# Patient Record
Sex: Male | Born: 1945 | Race: White | Hispanic: No | Marital: Married | Smoking: Former smoker
Health system: Southern US, Community
[De-identification: ages and names within clinical notes are randomized; demographics above are authoritative.]

## PROBLEM LIST (undated history)

## (undated) DIAGNOSIS — E7800 Pure hypercholesterolemia, unspecified: Secondary | ICD-10-CM

## (undated) DIAGNOSIS — I10 Essential (primary) hypertension: Secondary | ICD-10-CM

## (undated) DIAGNOSIS — G4730 Sleep apnea, unspecified: Secondary | ICD-10-CM

## (undated) DIAGNOSIS — M1990 Unspecified osteoarthritis, unspecified site: Secondary | ICD-10-CM

## (undated) DIAGNOSIS — G2581 Restless legs syndrome: Secondary | ICD-10-CM

## (undated) HISTORY — PX: SHOULDER ARTHROSCOPY: SHX128

## (undated) HISTORY — PX: TONSILLECTOMY AND ADENOIDECTOMY: SHX28

## (undated) HISTORY — PX: COLONOSCOPY: SHX174

## (undated) HISTORY — DX: Unspecified osteoarthritis, unspecified site: M19.90

---

## 2001-07-21 ENCOUNTER — Encounter (INDEPENDENT_AMBULATORY_CARE_PROVIDER_SITE_OTHER): Payer: Self-pay | Admitting: Specialist

## 2001-07-21 ENCOUNTER — Ambulatory Visit (HOSPITAL_COMMUNITY): Admission: RE | Admit: 2001-07-21 | Discharge: 2001-07-21 | Payer: Self-pay | Admitting: Gastroenterology

## 2003-04-21 ENCOUNTER — Ambulatory Visit (HOSPITAL_COMMUNITY): Admission: RE | Admit: 2003-04-21 | Discharge: 2003-04-21 | Payer: Self-pay | Admitting: Family Medicine

## 2004-04-10 ENCOUNTER — Ambulatory Visit (HOSPITAL_COMMUNITY): Admission: RE | Admit: 2004-04-10 | Discharge: 2004-04-10 | Payer: Self-pay | Admitting: Gastroenterology

## 2004-04-10 ENCOUNTER — Encounter (INDEPENDENT_AMBULATORY_CARE_PROVIDER_SITE_OTHER): Payer: Self-pay | Admitting: Specialist

## 2010-03-28 ENCOUNTER — Ambulatory Visit: Payer: Self-pay | Admitting: Oncology

## 2010-04-06 LAB — CBC WITH DIFFERENTIAL/PLATELET
BASO%: 0.7 % (ref 0.0–2.0)
Basophils Absolute: 0 10*3/uL (ref 0.0–0.1)
EOS%: 1.2 % (ref 0.0–7.0)
Eosinophils Absolute: 0.1 10*3/uL (ref 0.0–0.5)
HCT: 50.4 % — ABNORMAL HIGH (ref 38.4–49.9)
HGB: 17.6 g/dL — ABNORMAL HIGH (ref 13.0–17.1)
LYMPH%: 23.8 % (ref 14.0–49.0)
MCH: 32.9 pg (ref 27.2–33.4)
MCHC: 34.9 g/dL (ref 32.0–36.0)
MCV: 94.1 fL (ref 79.3–98.0)
MONO#: 0.6 10*3/uL (ref 0.1–0.9)
MONO%: 9.4 % (ref 0.0–14.0)
NEUT#: 3.9 10*3/uL (ref 1.5–6.5)
NEUT%: 64.9 % (ref 39.0–75.0)
Platelets: 289 10*3/uL (ref 140–400)
RBC: 5.36 10*6/uL (ref 4.20–5.82)
RDW: 12.9 % (ref 11.0–14.6)
WBC: 6 10*3/uL (ref 4.0–10.3)
lymph#: 1.4 10*3/uL (ref 0.9–3.3)

## 2010-04-06 LAB — CHCC SMEAR

## 2010-04-07 LAB — COMPREHENSIVE METABOLIC PANEL
ALT: 29 U/L (ref 0–53)
AST: 22 U/L (ref 0–37)
Albumin: 4.5 g/dL (ref 3.5–5.2)
Alkaline Phosphatase: 57 U/L (ref 39–117)
BUN: 17 mg/dL (ref 6–23)
CO2: 25 mEq/L (ref 19–32)
Calcium: 9.7 mg/dL (ref 8.4–10.5)
Chloride: 101 mEq/L (ref 96–112)
Creatinine, Ser: 0.76 mg/dL (ref 0.40–1.50)
Glucose, Bld: 104 mg/dL — ABNORMAL HIGH (ref 70–99)
Potassium: 3.9 mEq/L (ref 3.5–5.3)
Sodium: 140 mEq/L (ref 135–145)
Total Bilirubin: 0.8 mg/dL (ref 0.3–1.2)
Total Protein: 7 g/dL (ref 6.0–8.3)

## 2010-04-07 LAB — IRON AND TIBC
%SAT: 33 % (ref 20–55)
Iron: 107 ug/dL (ref 42–165)
TIBC: 325 ug/dL (ref 215–435)
UIBC: 218 ug/dL

## 2010-04-07 LAB — FERRITIN: Ferritin: 336 ng/mL — ABNORMAL HIGH (ref 22–322)

## 2010-04-07 LAB — ERYTHROPOIETIN: Erythropoietin: 5 m[IU]/mL (ref 2.6–34.0)

## 2010-08-02 ENCOUNTER — Encounter (HOSPITAL_BASED_OUTPATIENT_CLINIC_OR_DEPARTMENT_OTHER): Payer: BC Managed Care – PPO | Admitting: Oncology

## 2010-08-02 ENCOUNTER — Other Ambulatory Visit: Payer: Self-pay | Admitting: Oncology

## 2010-08-02 DIAGNOSIS — D751 Secondary polycythemia: Secondary | ICD-10-CM

## 2010-08-02 LAB — CBC WITH DIFFERENTIAL/PLATELET
BASO%: 0.2 % (ref 0.0–2.0)
Basophils Absolute: 0 10*3/uL (ref 0.0–0.1)
EOS%: 1.6 % (ref 0.0–7.0)
Eosinophils Absolute: 0.1 10*3/uL (ref 0.0–0.5)
HCT: 48.2 % (ref 38.4–49.9)
HGB: 16.8 g/dL (ref 13.0–17.1)
LYMPH%: 28.1 % (ref 14.0–49.0)
MCH: 32.6 pg (ref 27.2–33.4)
MCHC: 34.8 g/dL (ref 32.0–36.0)
MCV: 93.7 fL (ref 79.3–98.0)
MONO#: 0.5 10*3/uL (ref 0.1–0.9)
MONO%: 10.4 % (ref 0.0–14.0)
NEUT#: 2.7 10*3/uL (ref 1.5–6.5)
NEUT%: 59.7 % (ref 39.0–75.0)
Platelets: 274 10*3/uL (ref 140–400)
RBC: 5.15 10*6/uL (ref 4.20–5.82)
RDW: 13.1 % (ref 11.0–14.6)
WBC: 4.5 10*3/uL (ref 4.0–10.3)
lymph#: 1.3 10*3/uL (ref 0.9–3.3)

## 2010-08-07 LAB — JAK-2 V617F

## 2010-10-06 NOTE — Op Note (Signed)
NAMEDURAN, OHERN           ACCOUNT NO.:  [id number]   MEDICAL RECORD NO.:  [id number]          PATIENT TYPE:  AMB   LOCATION:  ENDO                         FACILITY:  [facility]   PHYSICIAN:  Petra Kuba, M.D.    DATE OF BIRTH:  09/18/1945   DATE OF PROCEDURE:  04/10/2004  DATE OF DISCHARGE:                                 OPERATIVE REPORT   PROCEDURE:  Colonoscopy with polypectomy.   INDICATIONS:  Patient with history of colon polyps.  Here for repeat  screening.  Consent was signed after risks, benefits, methods, options were  thoroughly discussed in the office.   MEDICINES USED:  1.  Demerol 100.  2.  Versed 10.   PROCEDURE:  Rectal inspection was pertinent for external hemorrhoids, small.  Digital exam was negative.  Video pediatric adjustable colonoscope was  inserted and easily advanced around the colon to the cecum.  This did not  require any abdominal pressure or any position changes.  On insertion, some  left greater than right diverticula were seen.  The cecum was identified by  the appendiceal orifice and the ileocecal valve.  The scope was slowly  withdrawn.  In the cecal pole, a small polyp was seen, snared,  electrocautery applied, and polyp resected through the scope and collected  in the trap.  On slow withdrawal in the ascending and the more proximal  transverse, three other tiny polyps were seen and were all hot biopsied x 1  and put in the same container with the polyp that was just snared.  The  scope was slowly withdrawn.  Again, the left greater than right diverticula  were confirmed.  On the left side of the colon, three tiny polyps were seen  in the descending and sigmoid and were all hot biopsied and put in the  second container.  Once back in the rectum, anorectal pull-through and  retroflexion confirmed some small hemorrhoids.  The scope was straightened  and readvanced a short ways up the left side of the colon.  Air was  suctioned and the scope  removed.  If not mentioned above, the prep was  adequate.  There was some liquid stool that required washing and suctioning.  The patient tolerated the procedure well.  There was no obvious immediate  complication.   ENDOSCOPIC DIAGNOSES:  1.  Internal and external hemorrhoids.  2.  Left greater than right diverticula.  3.  Three tiny left-sided polyps, hot biopsied  4.  One cecal polyp, small, snared.  5.  Three tiny right-sided polyps, hot biopsied.  6.  Otherwise within normal limits to the cecum.   PLAN:  Await pathology to determine future colonic screening.  I would be  happy to see him back p.r.n.  Otherwise, return care to Dr. Idell Pickles for the  customary health care maintenance to include yearly rectals and guaiacs.       MEM/MEDQ  D:  04/10/2004  T:  04/10/2004  Job:  [id number]   cc:   Petra Kuba, M.D.  [address], [address]  [place]  [place] [address]  Fax: [phone]

## 2011-01-31 ENCOUNTER — Other Ambulatory Visit: Payer: Self-pay | Admitting: Oncology

## 2011-01-31 ENCOUNTER — Encounter (HOSPITAL_BASED_OUTPATIENT_CLINIC_OR_DEPARTMENT_OTHER): Payer: Medicare Other | Admitting: Oncology

## 2011-01-31 DIAGNOSIS — D751 Secondary polycythemia: Secondary | ICD-10-CM

## 2011-01-31 LAB — CBC WITH DIFFERENTIAL/PLATELET
BASO%: 0.2 % (ref 0.0–2.0)
Basophils Absolute: 0 10*3/uL (ref 0.0–0.1)
EOS%: 1.6 % (ref 0.0–7.0)
Eosinophils Absolute: 0.1 10*3/uL (ref 0.0–0.5)
HCT: 49.3 % (ref 38.4–49.9)
HGB: 17.2 g/dL — ABNORMAL HIGH (ref 13.0–17.1)
LYMPH%: 24.9 % (ref 14.0–49.0)
MCH: 33 pg (ref 27.2–33.4)
MCHC: 34.8 g/dL (ref 32.0–36.0)
MCV: 94.9 fL (ref 79.3–98.0)
MONO#: 0.5 10*3/uL (ref 0.1–0.9)
MONO%: 10 % (ref 0.0–14.0)
NEUT#: 3.5 10*3/uL (ref 1.5–6.5)
NEUT%: 63.3 % (ref 39.0–75.0)
Platelets: 258 10*3/uL (ref 140–400)
RBC: 5.2 10*6/uL (ref 4.20–5.82)
RDW: 13.1 % (ref 11.0–14.6)
WBC: 5.5 10*3/uL (ref 4.0–10.3)
lymph#: 1.4 10*3/uL (ref 0.9–3.3)

## 2011-08-13 DIAGNOSIS — R7301 Impaired fasting glucose: Secondary | ICD-10-CM | POA: Diagnosis not present

## 2011-08-13 DIAGNOSIS — E782 Mixed hyperlipidemia: Secondary | ICD-10-CM | POA: Diagnosis not present

## 2011-08-13 DIAGNOSIS — Z79899 Other long term (current) drug therapy: Secondary | ICD-10-CM | POA: Diagnosis not present

## 2012-02-11 DIAGNOSIS — Z79899 Other long term (current) drug therapy: Secondary | ICD-10-CM | POA: Diagnosis not present

## 2012-02-11 DIAGNOSIS — E782 Mixed hyperlipidemia: Secondary | ICD-10-CM | POA: Diagnosis not present

## 2012-02-11 DIAGNOSIS — R7301 Impaired fasting glucose: Secondary | ICD-10-CM | POA: Diagnosis not present

## 2012-03-06 DIAGNOSIS — Z1331 Encounter for screening for depression: Secondary | ICD-10-CM | POA: Diagnosis not present

## 2012-03-06 DIAGNOSIS — G4730 Sleep apnea, unspecified: Secondary | ICD-10-CM | POA: Diagnosis not present

## 2012-03-06 DIAGNOSIS — R5381 Other malaise: Secondary | ICD-10-CM | POA: Diagnosis not present

## 2012-03-27 DIAGNOSIS — G479 Sleep disorder, unspecified: Secondary | ICD-10-CM | POA: Diagnosis not present

## 2012-04-01 DIAGNOSIS — G4733 Obstructive sleep apnea (adult) (pediatric): Secondary | ICD-10-CM | POA: Diagnosis not present

## 2012-06-05 ENCOUNTER — Other Ambulatory Visit: Payer: Self-pay | Admitting: Gastroenterology

## 2012-06-05 DIAGNOSIS — D126 Benign neoplasm of colon, unspecified: Secondary | ICD-10-CM | POA: Diagnosis not present

## 2012-06-05 DIAGNOSIS — K5730 Diverticulosis of large intestine without perforation or abscess without bleeding: Secondary | ICD-10-CM | POA: Diagnosis not present

## 2012-06-05 DIAGNOSIS — Z09 Encounter for follow-up examination after completed treatment for conditions other than malignant neoplasm: Secondary | ICD-10-CM | POA: Diagnosis not present

## 2012-06-05 DIAGNOSIS — Z86010 Personal history of colonic polyps: Secondary | ICD-10-CM | POA: Diagnosis not present

## 2012-06-30 DIAGNOSIS — G4733 Obstructive sleep apnea (adult) (pediatric): Secondary | ICD-10-CM | POA: Diagnosis not present

## 2012-11-07 ENCOUNTER — Emergency Department (HOSPITAL_COMMUNITY)
Admission: EM | Admit: 2012-11-07 | Discharge: 2012-11-08 | Disposition: A | Discharge status: Home / Self Care | Payer: Medicare Other | Attending: Emergency Medicine | Admitting: Emergency Medicine

## 2012-11-07 ENCOUNTER — Encounter (HOSPITAL_COMMUNITY): Payer: Self-pay

## 2012-11-07 DIAGNOSIS — G2581 Restless legs syndrome: Secondary | ICD-10-CM | POA: Diagnosis not present

## 2012-11-07 DIAGNOSIS — G4730 Sleep apnea, unspecified: Secondary | ICD-10-CM | POA: Diagnosis not present

## 2012-11-07 DIAGNOSIS — G40909 Epilepsy, unspecified, not intractable, without status epilepticus: Secondary | ICD-10-CM | POA: Insufficient documentation

## 2012-11-07 DIAGNOSIS — Z79899 Other long term (current) drug therapy: Secondary | ICD-10-CM | POA: Diagnosis not present

## 2012-11-07 DIAGNOSIS — R569 Unspecified convulsions: Secondary | ICD-10-CM | POA: Diagnosis not present

## 2012-11-07 DIAGNOSIS — R9431 Abnormal electrocardiogram [ECG] [EKG]: Secondary | ICD-10-CM | POA: Diagnosis not present

## 2012-11-07 DIAGNOSIS — E7800 Pure hypercholesterolemia, unspecified: Secondary | ICD-10-CM | POA: Diagnosis not present

## 2012-11-07 DIAGNOSIS — R55 Syncope and collapse: Secondary | ICD-10-CM | POA: Diagnosis not present

## 2012-11-07 DIAGNOSIS — R404 Transient alteration of awareness: Secondary | ICD-10-CM | POA: Diagnosis not present

## 2012-11-07 DIAGNOSIS — Z87891 Personal history of nicotine dependence: Secondary | ICD-10-CM | POA: Insufficient documentation

## 2012-11-07 DIAGNOSIS — I10 Essential (primary) hypertension: Secondary | ICD-10-CM | POA: Insufficient documentation

## 2012-11-07 HISTORY — DX: Sleep apnea, unspecified: G47.30

## 2012-11-07 HISTORY — DX: Pure hypercholesterolemia, unspecified: E78.00

## 2012-11-07 HISTORY — DX: Essential (primary) hypertension: I10

## 2012-11-07 HISTORY — DX: Restless legs syndrome: G25.81

## 2012-11-07 NOTE — ED Notes (Signed)
WUJ:[id number]<JY> Expected date:<BR> Expected time:<BR> Means of arrival:<BR> Comments:<BR> EMS, near syncope

## 2012-11-07 NOTE — ED Notes (Addendum)
Pt comes from home via EMS; pt was at home sitting down and family saw him slump over; pt says he was conscious and does remember the incident. Per EMS, stroke exam negative. Pt does have some ETOH on board. Per EMS, 12 lead unremarkable. Pt has no complaints but just wanted to come and get checked in. Pt told EMS he did have more alcohol than usual tonight. Pt does have a hx of sleep apnea and wears CPAP for it.

## 2012-11-08 ENCOUNTER — Emergency Department (HOSPITAL_COMMUNITY): Payer: Medicare Other

## 2012-11-08 DIAGNOSIS — R55 Syncope and collapse: Secondary | ICD-10-CM | POA: Diagnosis not present

## 2012-11-08 LAB — CBC
HCT: 43.8 % (ref 39.0–52.0)
Hemoglobin: 15.2 g/dL (ref 13.0–17.0)
MCH: 32.5 pg (ref 26.0–34.0)
MCHC: 34.7 g/dL (ref 30.0–36.0)
MCV: 93.8 fL (ref 78.0–100.0)
Platelets: 263 10*3/uL (ref 150–400)
RBC: 4.67 MIL/uL (ref 4.22–5.81)
RDW: 13.2 % (ref 11.5–15.5)
WBC: 5.1 10*3/uL (ref 4.0–10.5)

## 2012-11-08 LAB — POCT I-STAT, CHEM 8
BUN: 14 mg/dL (ref 6–23)
Calcium, Ion: 1.16 mmol/L (ref 1.13–1.30)
Chloride: 104 mEq/L (ref 96–112)
Creatinine, Ser: 1 mg/dL (ref 0.50–1.35)
Glucose, Bld: 128 mg/dL — ABNORMAL HIGH (ref 70–99)
HCT: 45 % (ref 39.0–52.0)
Hemoglobin: 15.3 g/dL (ref 13.0–17.0)
Potassium: 3.3 mEq/L — ABNORMAL LOW (ref 3.5–5.1)
Sodium: 144 mEq/L (ref 135–145)
TCO2: 27 mmol/L (ref 0–100)

## 2012-11-08 LAB — ETHANOL: Alcohol, Ethyl (B): 74 mg/dL — ABNORMAL HIGH (ref 0–11)

## 2012-11-08 NOTE — ED Provider Notes (Signed)
History     CSN: [id number]  Arrival date & time 11/07/12  2303   First MD Initiated Contact with Patient 11/08/12 0227      Chief Complaint  Patient presents with  . Altered Level of Consciousness     (Consider location/radiation/quality/duration/timing/severity/associated sxs/prior treatment) HPI This is a 67 year old male with a history of sleep apnea on nightly BiPAP. He is here after the patient had an altered level of consciousness. The patient himself describes it as like falling asleep yet he was aware of what people were saying and doing around him. He was unable to speak to them. A family member states his legs and left arm were trembling. He was unable to answer questions or speak for a total of about 15 minutes, although he states he was aware of this entire time. The episode ended suddenly and he was back to his normal level of consciousness. He has had some similar episodes in the past but never this severe. He states he has been compliant with his BiPAP. He admits to having some alcohol this evening but denies drinking excessively. There was no incontinence or injury to his tongue.  Past Medical History  Diagnosis Date  . Sleep apnea   . Hypertension   . High cholesterol   . Restless leg syndrome     History reviewed. No pertinent past surgical history.  No family history on file.  History  Substance Use Topics  . Smoking status: Former Games developer  . Smokeless tobacco: Not on file  . Alcohol Use: Yes     Comment: daily       Review of Systems  All other systems reviewed and are negative.    Allergies  Review of patient's allergies indicates no known allergies.  Home Medications   Current Outpatient Rx  Name  Route  Sig  Dispense  Refill  . amLODipine (NORVASC) 10 MG tablet   Oral   Take 10 mg by mouth daily.         . benazepril-hydrochlorthiazide (LOTENSIN HCT) 20-12.5 MG per tablet   Oral   Take 2 tablets by mouth daily.         .  pravastatin (PRAVACHOL) 40 MG tablet   Oral   Take 40 mg by mouth daily.         Marland Kitchen rOPINIRole (REQUIP) 4 MG tablet   Oral   Take 4 mg by mouth at bedtime.           BP 131/61  Pulse 68  Temp(Src) 97.8 F (36.6 C) (Oral)  Resp 18  SpO2 96%  Physical Exam General: Well-developed, well-nourished male in no acute distress; appearance consistent with age of record HENT: normocephalic, atraumatic Eyes: pupils equal round and reactive to light; extraocular muscles intact Neck: supple Heart: regular rate and rhythm Lungs: clear to auscultation bilaterally Abdomen: soft; nondistended; bowel sounds present Extremities: No deformity; full range of motion; pulses normal Neurologic: Awake, alert and oriented; motor function intact in all extremities and symmetric; no facial droop Skin: Warm and dry Psychiatric: Normal mood and affect    ED Course  Procedures (including critical care time)     MDM   Nursing notes and vitals signs, including pulse oximetry, reviewed.  Summary of this visit's results, reviewed by myself:  Labs:  Results for orders placed during the hospital encounter of 11/07/12 (from the past 24 hour(s))  CBC     Status: None   Collection Time    11/08/12 12:15 AM  Result Value Range   WBC 5.1  4.0 - 10.5 K/uL   RBC 4.67  4.22 - 5.81 MIL/uL   Hemoglobin 15.2  13.0 - 17.0 g/dL   HCT 16.1  09.6 - 04.5 %   MCV 93.8  78.0 - 100.0 fL   MCH 32.5  26.0 - 34.0 pg   MCHC 34.7  30.0 - 36.0 g/dL   RDW 40.9  81.1 - 91.4 %   Platelets 263  150 - 400 K/uL  ETHANOL     Status: Abnormal   Collection Time    11/08/12 12:15 AM      Result Value Range   Alcohol, Ethyl (B) 74 (*) 0 - 11 mg/dL  POCT I-STAT, CHEM 8     Status: Abnormal   Collection Time    11/08/12 12:26 AM      Result Value Range   Sodium 144  135 - 145 mEq/L   Potassium 3.3 (*) 3.5 - 5.1 mEq/L   Chloride 104  96 - 112 mEq/L   BUN 14  6 - 23 mg/dL   Creatinine, Ser 7.82  0.50 - 1.35 mg/dL    Glucose, Bld 956 (*) 70 - 99 mg/dL   Calcium, Ion 2.13  0.86 - 1.30 mmol/L   TCO2 27  0 - 100 mmol/L   Hemoglobin 15.3  13.0 - 17.0 g/dL   HCT 57.8  46.9 - 62.9 %    Imaging Studies: Ct Head Wo Contrast  11/08/2012   *RADIOLOGY REPORT*  Clinical Data: Near-syncope, history hypertension  CT HEAD WITHOUT CONTRAST  Technique:  Contiguous axial images were obtained from the base of the skull through the vertex without contrast.  Comparison: None  Findings: Normal ventricular morphology. No midline shift or mass effect. Normal appearance of brain parenchyma. No intracranial hemorrhage, mass lesion, or evidence of acute infarction. No extra-axial fluid collections. Bones sinuses unremarkable.  IMPRESSION: No acute intracranial abnormalities.   Original Report Authenticated By: Ulyses Southward, M.D.     EKG Interpretation:  Date & Time: 11/07/2012 11:14 PM  Rate: 66  Rhythm: normal sinus rhythm  QRS Axis: normal  Intervals: normal  ST/T Wave abnormalities: nonspecific T wave changes  Conduction Disutrbances:none  Narrative Interpretation: Poor R-wave progression  Old EKG Reviewed: none available  4:17 AM Patient has had no further episodes while in the ED. His symptoms are concerning for a partial seizure. He was advised that until he is cleared by a neurologist is not to drive or operate machinery. He was advised that should he do so uncalled harm to others he is responsible.         Hanley Seamen, MD 11/08/12 [phone]

## 2012-11-08 NOTE — ED Notes (Signed)
Dr. Molpus at bedside. 

## 2012-11-08 NOTE — ED Notes (Signed)
Pt has a hx of sleep apnea and desats down to 83% on RA when he falls asleep. Pt placed on 3L of O2 at this time.

## 2012-11-10 DIAGNOSIS — R404 Transient alteration of awareness: Secondary | ICD-10-CM | POA: Diagnosis not present

## 2012-12-01 ENCOUNTER — Encounter: Payer: Self-pay | Admitting: Neurology

## 2012-12-01 ENCOUNTER — Ambulatory Visit (INDEPENDENT_AMBULATORY_CARE_PROVIDER_SITE_OTHER): Payer: Medicare Other | Admitting: Neurology

## 2012-12-01 VITALS — BP 129/69 | HR 68 | Ht 71.0 in | Wt 219.0 lb

## 2012-12-01 DIAGNOSIS — M1990 Unspecified osteoarthritis, unspecified site: Secondary | ICD-10-CM

## 2012-12-01 DIAGNOSIS — G2581 Restless legs syndrome: Secondary | ICD-10-CM | POA: Diagnosis not present

## 2012-12-01 DIAGNOSIS — R404 Transient alteration of awareness: Secondary | ICD-10-CM | POA: Diagnosis not present

## 2012-12-01 DIAGNOSIS — E7800 Pure hypercholesterolemia, unspecified: Secondary | ICD-10-CM | POA: Insufficient documentation

## 2012-12-01 NOTE — Progress Notes (Signed)
[facility]  PATIENT: Justin Little DOB: 1946-03-08  HISTORICAL  Justin Little is a 67 years old right-handed Caucasian male, referred by his primary care physician Dr. Clarene Duke for evaluation of episodes of alteration of consciousness.  He has past medical history of hypertension, hyperlipidemia, he experienced a few episodes of alteration of consciousness.  He has restless leg syndrome, He usually took repinirole 4mg  every night for his restless legs, in early June 2013, he took his medicine about 9 PM, while talking with his somebody around 21 PM, he suddenly "fell into sleep" , he could not hear conversation, but could not respond, symptoms last for a few minutes, he was taking by ambulance to the emergency room in November 07 2012, CAT scan of the brain was normal, laboratory showed normal CBC, CMP, blood alcohol level was 74,  Secondary similar episode was 2 weeks later, he was playing card with his guest around 10 PM,   he took his Requip 4 mg one hour before the symptoms onset, he suddenly has his eyes closed, could hear people surrounding him, but could not respond, there was no seizure activity noticed.   There was one more episode about 2 and half weeks ago, this time he was sitting watching TV, he did not take his Requip prior to symptoms onset, he suddenly nodded off, few minutes, no loss of consciousness .  He has obstructive sleep apnea, using BiPAP machine, today's ESS is 6, Fss is 10    REVIEW OF SYSTEMS: Full 14 system review of systems performed and notable only for snoring, restless leg  ALLERGIES: No Known Allergies  HOME MEDICATIONS: Outpatient Prescriptions Prior to Visit  Medication Sig Dispense Refill  . amLODipine (NORVASC) 10 MG tablet Take 10 mg by mouth daily.      . benazepril-hydrochlorthiazide (LOTENSIN HCT) 20-12.5 MG per tablet Take 2 tablets by mouth daily.      . pravastatin (PRAVACHOL) 40 MG tablet Take 40 mg by mouth daily.      Marland Kitchen  rOPINIRole (REQUIP) 4 MG tablet Take 4 mg by mouth at bedtime.       No facility-administered medications prior to visit.    PAST MEDICAL HISTORY: Past Medical History  Diagnosis Date  . Sleep apnea   . Hypertension   . High cholesterol   . Restless leg syndrome   . Degenerative arthritis   . Restless leg syndrome   . High cholesterol     PAST SURGICAL HISTORY: Past Surgical History  Procedure Laterality Date  . Shoulder arthroscopy    . Tonsillectomy and adenoidectomy    . Colonoscopy      FAMILY HISTORY: Family History  Problem Relation Age of Onset  . Heart Problems Father   . High blood pressure Mother   . Cancer Mother     SOCIAL HISTORY:  History   Social History  . Marital Status: Married    Spouse Name: Marisue Humble    Number of Children: 2  . Years of Education: college   Occupational History  .      Sales   Social History Main Topics  . Smoking status: Former Smoker    Types: Cigarettes  . Smokeless tobacco: Never Used     Comment: Quit 1985  . Alcohol Use: 0.6 oz/week    1 Shots of liquor per week     Comment: daily   . Drug Use: No  . Sexually Active: Not on file   Other Topics Concern  .  Not on file   Social History Narrative   Patient lives at home with his wife Marisue Humble). Patient is still working works with Airline pilot.    Right handed.   Caffeine - None     PHYSICAL EXAM  Filed Vitals:   12/01/12 0910  BP: 129/69  Pulse: 68  Height: 5\' 11"  (1.803 m)  Weight: 219 lb (99.338 kg)    Not recorded    Body mass index is 30.56 kg/(m^2).   Generalized: In no acute distress  Neck: Supple, no carotid bruits   Cardiac: Regular rate rhythm  Pulmonary: Clear to auscultation bilaterally  Musculoskeletal: No deformity  Neurological examination  Mentation: Alert oriented to time, place, history taking, and causual conversation  Cranial nerve II-XII: Pupils were equal round reactive to light extraocular movements were full, visual  field were full on confrontational test. facial sensation and strength were normal. hearing was intact to finger rubbing bilaterally. Uvula tongue midline.  head turning and shoulder shrug and were normal and symmetric.Tongue protrusion into cheek strength was normal.  Motor: normal tone, bulk and strength.  Sensory: Intact to fine touch, pinprick, preserved vibratory sensation, and proprioception at toes.  Coordination: Normal finger to nose, heel-to-shin bilaterally there was no truncal ataxia  Gait: Rising up from seated position without assistance, normal stance, without trunk ataxia, moderate stride, good arm swing, smooth turning, able to perform tiptoe, and heel walking without difficulty.   Romberg signs: Negative  Deep tendon reflexes: Brachioradialis 2/2, biceps 2/2, triceps 2/2, patellar 2/2, Achilles 2/2, plantar responses were flexor bilaterally.   DIAGNOSTIC DATA (LABS, IMAGING, TESTING) - I reviewed patient records, labs, notes, testing and imaging myself where available.  Lab Results  Component Value Date   WBC 5.1 11/08/2012   HGB 15.3 11/08/2012   HCT 45.0 11/08/2012   MCV 93.8 11/08/2012   PLT 263 11/08/2012      Component Value Date/Time   NA 144 11/08/2012 0026   K 3.3* 11/08/2012 0026   CL 104 11/08/2012 0026   CO2 25 04/06/2010 1330   GLUCOSE 128* 11/08/2012 0026   BUN 14 11/08/2012 0026   CREATININE 1.00 11/08/2012 0026   CALCIUM 9.7 04/06/2010 1330   PROT 7.0 04/06/2010 1330   ALBUMIN 4.5 04/06/2010 1330   AST 22 04/06/2010 1330   ALT 29 04/06/2010 1330   ALKPHOS 57 04/06/2010 1330   BILITOT 0.8 04/06/2010 1330   ASSESSMENT AND PLAN  67 years old right-handed Caucasian male, with past medical history of hypertension, hyperlipidemia, restless leg syndrome, presenting with few episode of sudden loss of ability to intact with surrounding, lasting few minutes, there was no seizure-like activity described, normal neurological examinations.  1 differentiation  diagnosis including complex partial seizure, narcolepsy 2  MRI of the brain with and without contrast, EEG 3. No driving till episode free for 6 months   4. if he continues to have recurrent symptoms, we will consider sleep study as well       Levert Feinstein, M.D. Ph.D.  [facility] [address], [address] [place], [place] [address] [phone]

## 2012-12-04 ENCOUNTER — Ambulatory Visit (INDEPENDENT_AMBULATORY_CARE_PROVIDER_SITE_OTHER): Payer: Medicare Other

## 2012-12-04 DIAGNOSIS — E7800 Pure hypercholesterolemia, unspecified: Secondary | ICD-10-CM

## 2012-12-04 DIAGNOSIS — R404 Transient alteration of awareness: Secondary | ICD-10-CM

## 2012-12-04 DIAGNOSIS — M1990 Unspecified osteoarthritis, unspecified site: Secondary | ICD-10-CM

## 2012-12-04 DIAGNOSIS — G2581 Restless legs syndrome: Secondary | ICD-10-CM

## 2012-12-05 NOTE — Progress Notes (Signed)
This is an addendum on EEG report, he was able to achieve stage II sleep during recording

## 2012-12-05 NOTE — Procedures (Signed)
HISTORY: 67 year-old male, was episode of alteration of consciousness  TECHNIQUE:  16 channel EEG was performed based on standard 10-16 international system. One channel was dedicated to EKG, which has demonstrates normal sinus rhythm of 78 beats per minutes.  Upon awakening, the posterior background activity was well-developed, in alpha range, 10Hz ,  with amplitude of 25 microvoltage, reactive to eye opening and closure.  There was no evidence of epilepsy for discharge.  Photic stimulation was performed, which induced a symmetric photic driving.  Hyperventilation was performed, there was no abnormality elicit.  No sleep was achieved.  CONCLUSION: This is a  normal awake EEG.  There is no electrodiagnostic evidence of epileptiform discharge

## 2012-12-12 ENCOUNTER — Telehealth: Payer: Self-pay | Admitting: Neurology

## 2012-12-12 NOTE — Telephone Encounter (Signed)
I called the patient and and gave him his EEG results. And i let him know i will have someone in MRI call him with the status on his MRI. Patient will be going away in the next week and needs it done before then.

## 2012-12-19 ENCOUNTER — Ambulatory Visit
Admission: RE | Admit: 2012-12-19 | Discharge: 2012-12-19 | Disposition: A | Discharge status: Home / Self Care | Payer: Medicare Other | Source: Ambulatory Visit | Attending: Neurology | Admitting: Neurology

## 2012-12-19 ENCOUNTER — Telehealth: Payer: Self-pay | Admitting: Neurology

## 2012-12-19 DIAGNOSIS — G2581 Restless legs syndrome: Secondary | ICD-10-CM

## 2012-12-19 DIAGNOSIS — R4182 Altered mental status, unspecified: Secondary | ICD-10-CM | POA: Diagnosis not present

## 2012-12-19 DIAGNOSIS — I6789 Other cerebrovascular disease: Secondary | ICD-10-CM | POA: Diagnosis not present

## 2012-12-19 DIAGNOSIS — E7800 Pure hypercholesterolemia, unspecified: Secondary | ICD-10-CM

## 2012-12-19 DIAGNOSIS — M1990 Unspecified osteoarthritis, unspecified site: Secondary | ICD-10-CM

## 2012-12-19 DIAGNOSIS — R404 Transient alteration of awareness: Secondary | ICD-10-CM

## 2012-12-19 MED ORDER — GADOBENATE DIMEGLUMINE 529 MG/ML IV SOLN: 20.0000 mL | Freq: Once | INTRAVENOUS | Status: AC | PRN

## 2012-12-22 NOTE — Telephone Encounter (Signed)
I called the patient and let him know his results where not ready but once they are, we will give him a call with results.

## 2012-12-26 ENCOUNTER — Telehealth: Payer: Self-pay | Admitting: Neurology

## 2012-12-29 NOTE — Telephone Encounter (Signed)
This pt by the office today.  [facility] was called and stated that [facility] to read. I did not see that Dr. Pearlean Brownie read.  Dr. Marjory Lies consulted, and this was not sent over on the list for [place] to know it needed to be read.  Dr. Marjory Lies or Dr. Pearlean Brownie to read today and pt to be called.

## 2012-12-29 NOTE — Telephone Encounter (Signed)
Dr. Pearlean Brownie read MRI brain.  I spoke to patient and relayed results of generalized cerebral atrophy and mild changes of chronic microvascular ischemia, also not significant changes compared with CT head 11-08-12.  I will relay information to Dr. Terrace Arabia and see when she would like to see patient in follow up.

## 2013-01-05 ENCOUNTER — Telehealth: Payer: Self-pay | Admitting: Neurology

## 2013-01-05 NOTE — Telephone Encounter (Signed)
Please give patient a follow up in 2-3 months

## 2013-01-07 NOTE — Progress Notes (Signed)
Quick Note:  Please call patient, age related changes. ______

## 2013-01-08 ENCOUNTER — Telehealth: Payer: Self-pay

## 2013-01-08 NOTE — Telephone Encounter (Signed)
I called and spoke with wife re: MRI results. She said they came in and got the results last week. I let her know her husbands doctor was out for two weeks and just sent a note this morning. Wife asked why another doctor couldn't have read the results then. I said when results come they go to the ordering doctor, not to all doctors. She is concerned that there are no MRI changes except age related and we do not want to see her husband soon. She said, "You're not concerned that he could drive again and he could pass out". I let her know I can pass that information along and see what the doctor has to say. Wife said, we need to do things the old way and they are going to look for another doctor.

## 2013-01-08 NOTE — Telephone Encounter (Signed)
Message copied by [facility] on Thu Jan 08, 2013  8:37 AM ------      Message from: Levert Feinstein      Created: Wed Jan 07, 2013  4:45 PM       Please call patient, age related changes. ------

## 2013-01-09 ENCOUNTER — Telehealth: Payer: Self-pay | Admitting: Neurology

## 2013-01-09 NOTE — Telephone Encounter (Signed)
Patient's follow up appointment is 02-27-13.

## 2013-01-09 NOTE — Telephone Encounter (Signed)
I have discussed with Justin Little, patient and his wife are concerned about the MRI result.  I have advised my office to give him a followup in next available, we can go over film in detail during face to face encounter.

## 2013-02-27 ENCOUNTER — Encounter (INDEPENDENT_AMBULATORY_CARE_PROVIDER_SITE_OTHER): Payer: Self-pay

## 2013-02-27 ENCOUNTER — Encounter: Payer: Self-pay | Admitting: Neurology

## 2013-02-27 ENCOUNTER — Ambulatory Visit (INDEPENDENT_AMBULATORY_CARE_PROVIDER_SITE_OTHER): Payer: Medicare Other | Admitting: Neurology

## 2013-02-27 VITALS — BP 135/67 | HR 74 | Ht 71.0 in | Wt 222.0 lb

## 2013-02-27 DIAGNOSIS — M1990 Unspecified osteoarthritis, unspecified site: Secondary | ICD-10-CM

## 2013-02-27 DIAGNOSIS — R404 Transient alteration of awareness: Secondary | ICD-10-CM

## 2013-02-27 DIAGNOSIS — E7800 Pure hypercholesterolemia, unspecified: Secondary | ICD-10-CM

## 2013-02-27 DIAGNOSIS — G2581 Restless legs syndrome: Secondary | ICD-10-CM

## 2013-02-27 NOTE — Progress Notes (Signed)
[facility]  PATIENT: Justin Little DOB: 1946-04-18  HISTORICAL  Justin Little is a 67 years old right-handed Caucasian male, referred by his primary care physician Dr. Clarene Duke for evaluation of episodes of alteration of consciousness.  He has past medical history of hypertension, hyperlipidemia, he experienced a few episodes of alteration of consciousness.  He has restless leg syndrome, He usually took repinirole 4mg  every night for his restless legs, in early June 2013, he took his medicine about 9 PM, while talking with his somebody around 104 PM, he suddenly "fell into sleep" , he could not hear conversation, but could not respond, symptoms last for a few minutes, he was taking by ambulance to the emergency room in November 07 2012, CAT scan of the brain was normal, laboratory showed normal CBC, CMP, blood alcohol level was 74,  Secondary similar episode was 2 weeks later, he was playing card with his guest around 10 PM,   he took his Requip 4 mg one hour before the symptoms onset, he suddenly has his eyes closed, could hear people surrounding him, but could not respond, there was no seizure activity noticed.   There was one more episode about 2 and half weeks ago, this time he was sitting watching TV, he did not take his Requip prior to symptoms onset, he suddenly nodded off, few minutes, no loss of consciousness .  He has obstructive sleep apnea, using BiPAP machine, today's ESS is 6, Fss is 10  UPDATE Oct 10th 2014:  He is doing well now, he is still taking requip 4mg  qhs, no significant side effect. I have reviewed MRI brain film, mild small vessel disease, no acute lesions, His EEG was normal   REVIEW OF SYSTEMS: Full 14 system review of systems performed and notable only for snoring, restless leg  ALLERGIES: No Known Allergies  HOME MEDICATIONS: Outpatient Prescriptions Prior to Visit  Medication Sig Dispense Refill  . amLODipine (NORVASC) 10 MG tablet Take  10 mg by mouth daily.      Marland Kitchen aspirin 81 MG tablet Take 81 mg by mouth daily.      . benazepril-hydrochlorthiazide (LOTENSIN HCT) 20-12.5 MG per tablet Take 2 tablets by mouth daily.      . pravastatin (PRAVACHOL) 40 MG tablet Take 40 mg by mouth daily.      Marland Kitchen rOPINIRole (REQUIP) 4 MG tablet Take 4 mg by mouth at bedtime.       No facility-administered medications prior to visit.    PAST MEDICAL HISTORY: Past Medical History  Diagnosis Date  . Sleep apnea   . Hypertension   . High cholesterol   . Restless leg syndrome   . Degenerative arthritis   . Restless leg syndrome   . High cholesterol     PAST SURGICAL HISTORY: Past Surgical History  Procedure Laterality Date  . Shoulder arthroscopy    . Tonsillectomy and adenoidectomy    . Colonoscopy      FAMILY HISTORY: Family History  Problem Relation Age of Onset  . Heart Problems Father   . High blood pressure Mother   . Cancer Mother     SOCIAL HISTORY:  History   Social History  . Marital Status: Married    Spouse Name: Justin Little    Number of Children: 2  . Years of Education: college   Occupational History  .      Sales   Social History Main Topics  . Smoking status: Former Smoker    Types:  Cigarettes  . Smokeless tobacco: Never Used     Comment: Quit 1985  . Alcohol Use: 0.6 oz/week    1 Shots of liquor per week     Comment: daily   . Drug Use: No  . Sexual Activity: Not on file   Other Topics Concern  . Not on file   Social History Narrative   Patient lives at home with his wife Justin Little). Patient is still working works with Airline [employer].    Right handed.   Caffeine - None     PHYSICAL EXAM  Filed Vitals:   02/27/13 1406  BP: 135/67  Pulse: 74  Height: 5\' 11"  (1.803 m)  Weight: 222 lb (100.699 kg)    Not recorded    Body mass index is 30.98 kg/(m^2).   Generalized: In no acute distress  Neck: Supple, no carotid bruits   Cardiac: Regular rate rhythm  Pulmonary: Clear to auscultation  bilaterally  Musculoskeletal: No deformity  Neurological examination  Mentation: Alert oriented to time, place, history taking, and causual conversation  Cranial nerve II-XII: Pupils were equal round reactive to light extraocular movements were full, visual field were full on confrontational test. facial sensation and strength were normal. hearing was intact to finger rubbing bilaterally. Uvula tongue midline.  head turning and shoulder shrug and were normal and symmetric.Tongue protrusion into cheek strength was normal.  Motor: normal tone, bulk and strength.  Sensory: Intact to fine touch, pinprick, preserved vibratory sensation, and proprioception at toes.  Coordination: Normal finger to nose, heel-to-shin bilaterally there was no truncal ataxia  Gait: Rising up from seated position without assistance, normal stance, without trunk ataxia, moderate stride, good arm swing, smooth turning, able to perform tiptoe, and heel walking without difficulty.   Romberg signs: Negative  Deep tendon reflexes: Brachioradialis 2/2, biceps 2/2, triceps 2/2, patellar 2/2, Achilles 2/2, plantar responses were flexor bilaterally.   DIAGNOSTIC DATA (LABS, IMAGING, TESTING) - I reviewed patient records, labs, notes, testing and imaging myself where available.  Lab Results  Component Value Date   WBC 5.1 11/08/2012   HGB 15.3 11/08/2012   HCT 45.0 11/08/2012   MCV 93.8 11/08/2012   PLT 263 11/08/2012      Component Value Date/Time   NA 144 11/08/2012 0026   K 3.3* 11/08/2012 0026   CL 104 11/08/2012 0026   CO2 25 04/06/2010 1330   GLUCOSE 128* 11/08/2012 0026   BUN 14 11/08/2012 0026   CREATININE 1.00 11/08/2012 0026   CALCIUM 9.7 04/06/2010 1330   PROT 7.0 04/06/2010 1330   ALBUMIN 4.5 04/06/2010 1330   AST 22 04/06/2010 1330   ALT 29 04/06/2010 1330   ALKPHOS 57 04/06/2010 1330   BILITOT 0.8 04/06/2010 1330   ASSESSMENT AND PLAN  67 years old right-handed Caucasian male, with past medical  history of hypertension, hyperlipidemia, restless leg syndrome, presenting with few episode of sudden loss of ability to intact with surrounding, lasting few minutes, there was no seizure-like activity described, normal neurological examinations.  Differentiation diagnosis including  Medicine side effect from requip, only return to clinic if new issues arise     Levert Feinstein, M.D. Ph.D.  [facility] [address] [place], [place] [address] [phone]

## 2013-04-30 DIAGNOSIS — R4182 Altered mental status, unspecified: Secondary | ICD-10-CM | POA: Diagnosis not present

## 2013-05-01 ENCOUNTER — Other Ambulatory Visit: Payer: Self-pay | Admitting: Family Medicine

## 2013-05-01 DIAGNOSIS — R4182 Altered mental status, unspecified: Secondary | ICD-10-CM

## 2013-05-06 ENCOUNTER — Ambulatory Visit
Admission: RE | Admit: 2013-05-06 | Discharge: 2013-05-06 | Disposition: A | Discharge status: Home / Self Care | Payer: Medicare Other | Source: Ambulatory Visit | Attending: Family Medicine | Admitting: Family Medicine

## 2013-05-06 DIAGNOSIS — R4182 Altered mental status, unspecified: Secondary | ICD-10-CM

## 2013-05-06 DIAGNOSIS — I658 Occlusion and stenosis of other precerebral arteries: Secondary | ICD-10-CM | POA: Diagnosis not present

## 2013-05-08 ENCOUNTER — Encounter (HOSPITAL_COMMUNITY): Payer: Self-pay | Admitting: Emergency Medicine

## 2013-05-08 ENCOUNTER — Emergency Department (HOSPITAL_COMMUNITY)
Admission: EM | Admit: 2013-05-08 | Discharge: 2013-05-08 | Disposition: A | Discharge status: Home / Self Care | Payer: Medicare Other | Attending: Emergency Medicine | Admitting: Emergency Medicine

## 2013-05-08 DIAGNOSIS — M1990 Unspecified osteoarthritis, unspecified site: Secondary | ICD-10-CM | POA: Diagnosis not present

## 2013-05-08 DIAGNOSIS — R404 Transient alteration of awareness: Secondary | ICD-10-CM | POA: Insufficient documentation

## 2013-05-08 DIAGNOSIS — R4789 Other speech disturbances: Secondary | ICD-10-CM | POA: Insufficient documentation

## 2013-05-08 DIAGNOSIS — I10 Essential (primary) hypertension: Secondary | ICD-10-CM | POA: Insufficient documentation

## 2013-05-08 DIAGNOSIS — Z87891 Personal history of nicotine dependence: Secondary | ICD-10-CM | POA: Insufficient documentation

## 2013-05-08 DIAGNOSIS — G2581 Restless legs syndrome: Secondary | ICD-10-CM | POA: Diagnosis not present

## 2013-05-08 DIAGNOSIS — Z79899 Other long term (current) drug therapy: Secondary | ICD-10-CM | POA: Insufficient documentation

## 2013-05-08 DIAGNOSIS — E7800 Pure hypercholesterolemia, unspecified: Secondary | ICD-10-CM | POA: Diagnosis not present

## 2013-05-08 DIAGNOSIS — Z7982 Long term (current) use of aspirin: Secondary | ICD-10-CM | POA: Diagnosis not present

## 2013-05-08 LAB — BASIC METABOLIC PANEL
BUN: 17 mg/dL (ref 6–23)
CO2: 24 mEq/L (ref 19–32)
Chloride: 99 mEq/L (ref 96–112)
Creatinine, Ser: 0.73 mg/dL (ref 0.50–1.35)
GFR calc Af Amer: >90 mL/min (ref >90)
GFR calc non Af Amer: >90 mL/min (ref >90)
Potassium: 3.2 mEq/L — ABNORMAL LOW (ref 3.5–5.1)
Sodium: 138 mEq/L (ref 135–145)

## 2013-05-08 LAB — CBC
HCT: 47.4 % (ref 39.0–52.0)
MCHC: 35.7 g/dL (ref 30.0–36.0)
RBC: 4.97 MIL/uL (ref 4.22–5.81)
RDW: 13.1 % (ref 11.5–15.5)
WBC: 5.5 10*3/uL (ref 4.0–10.5)

## 2013-05-08 MED ORDER — POTASSIUM CHLORIDE CRYS ER 20 MEQ PO TBCR
40.0000 meq | EXTENDED_RELEASE_TABLET | Freq: Once | ORAL | Status: AC
  Administered 2013-05-08: 40 meq via ORAL
  Filled 2013-05-08: qty 2

## 2013-05-08 MED ORDER — ROPINIROLE HCL 1 MG PO TABS
4.0000 mg | ORAL_TABLET | Freq: Every day | ORAL | Status: DC
  Administered 2013-05-08: 4 mg via ORAL
  Filled 2013-05-08: qty 4

## 2013-05-08 NOTE — ED Notes (Signed)
Pt and family report 1-2 min episode around 5pm of head going back, eyes open, not being able to speak but able to hear what is going on around him.  Family reports during episode pts L arm is moving back and forth like he is trying to use it. Pt states he has been having these episodes over the past several months.  Prior to tonight the last episode was 2 weeks ago.  Pt states he has been seen at [facility] for same, by PCP, and [facility].  Reports he has had negative EEG and MRI.  No neuro deficits at present.  Denies urinary incontinence during episodes.

## 2013-05-08 NOTE — ED Notes (Signed)
Pt reports that he has had multiple episodes over the past couple of months that consist of him having stroke like symptoms, but being able to hear everything around him during those episodes. States that he is unable to move or talk when this happens but, he is fully alert. No neuro deficits at this time. Pt A&Ox4. States that he has seen a neurologist and had an EEG, MRI and full work up.

## 2013-05-08 NOTE — ED Provider Notes (Signed)
I saw and evaluated the patient, reviewed the resident's note and I agree with the findings and plan.  EKG Interpretation    Date/Time:  Friday May 08 2013 18:27:18 EST Ventricular Rate:  90 PR Interval:  172 QRS Duration: 98 QT Interval:  350 QTC Calculation: 428 R Axis:   135 Text Interpretation:  Normal sinus rhythm Right axis deviation Pulmonary disease pattern Nonspecific ST and T wave abnormality Abnormal ECG No significant change since last tracing Confirmed by Nicole Defino  MD, Mando Blatz ([id number]) on 05/08/2013 11:01:02 PM            Outpatient seen by me in EKG interpreted as well. Patient is in the process of an extensive workup for multiple episodes over the past 2 months of either TIA or mini seizure or abs onset seizure activity. Patient's had extensive workup to this point in time has been negative. Being followed by neurology was [facility] neurology now switching to [facility] neurology. Patient is now back to baseline tonight episode was no different than the others. Discussed with family told to return for any new or worse type symptoms or anything that is different but we will be here. Otherwise continue his outpatient workup as planned. Patient is stable for discharge.  Shelda Jakes, MD 05/08/13 [phone]

## 2013-05-08 NOTE — ED Notes (Addendum)
Family brought pt food to eat. Pt ate mouthful of food in w/r at 20:20. Pt instructed not to eat anything else based on sx and complaints until he sees the MD and speaks with his RN in the back. Pt agreeable. Pt alert, NAD, calm, appropriate, speech clear, ambulatory with steady gait.

## 2013-05-08 NOTE — ED Provider Notes (Signed)
CSN: [id number]     Arrival date & time 05/08/13  1808 History   First MD Initiated Contact with Patient 05/08/13 2212     Chief Complaint  Patient presents with  . Near Syncope   HPI 67 y/o male with history as noted below who presents with syncopal like episodes. The patient state that over the past few months he "passes out" and "goes to sleep". He states that he is still able to hear people talk and what is going on around him but he is unable to move or talk when it happens. He currently denies any symptoms but states the episodes have become more frequent. He has seen neurology and has had a normal MRI, EEG and Carotid Dopplers. He is currently in the process of switching neurologist.   Past Medical History  Diagnosis Date  . Sleep apnea   . Hypertension   . High cholesterol   . Restless leg syndrome   . Degenerative arthritis   . Restless leg syndrome   . High cholesterol    Past Surgical History  Procedure Laterality Date  . Shoulder arthroscopy    . Tonsillectomy and adenoidectomy    . Colonoscopy     Family History  Problem Relation Age of Onset  . Heart Problems Father   . High blood pressure Mother   . Cancer Mother    History  Substance Use Topics  . Smoking status: Former Smoker    Types: Cigarettes  . Smokeless tobacco: Never Used     Comment: Quit 1985  . Alcohol Use: 0.6 oz/week    1 Shots of liquor per week     Comment: daily     Review of Systems  Constitutional: Negative for fever and chills.  Respiratory: Negative for shortness of breath.   Cardiovascular: Negative for chest pain and palpitations.  Gastrointestinal: Negative for nausea and vomiting.  Neurological: Positive for syncope and speech difficulty. Negative for weakness, numbness and headaches.  All other systems reviewed and are negative.    Allergies  Review of patient's allergies indicates no known allergies.  Home Medications   Current Outpatient Rx  Name  Route  Sig   Dispense  Refill  . amLODipine (NORVASC) 10 MG tablet   Oral   Take 10 mg by mouth daily.         Marland Kitchen aspirin EC 81 MG tablet   Oral   Take 324 mg by mouth daily.         . benazepril-hydrochlorthiazide (LOTENSIN HCT) 20-12.5 MG per tablet   Oral   Take 2 tablets by mouth daily.         . pravastatin (PRAVACHOL) 40 MG tablet   Oral   Take 40 mg by mouth daily.         Marland Kitchen rOPINIRole (REQUIP) 4 MG tablet   Oral   Take 4 mg by mouth at bedtime.          BP 126/72  Pulse 62  Temp(Src) 98.2 F (36.8 C) (Oral)  Resp 13  Wt 136 lb 8 oz (61.916 kg)  SpO2 91% Physical Exam  Constitutional: He is oriented to person, place, and time. He appears well-developed and well-nourished. No distress.  HENT:  Head: Normocephalic and atraumatic.  Mouth/Throat: No oropharyngeal exudate.  Eyes: Conjunctivae are normal. Pupils are equal, round, and reactive to light.  Neck: Normal range of motion. Neck supple.  Cardiovascular: Normal rate and normal heart sounds.  Exam reveals no gallop and  no friction rub.   No murmur heard. Pulmonary/Chest: Effort normal and breath sounds normal.  Abdominal: Soft. He exhibits no distension. There is no tenderness.  Musculoskeletal: Normal range of motion. He exhibits no edema and no tenderness.  Neurological: He is alert and oriented to person, place, and time. He has normal strength and normal reflexes. No cranial nerve deficit or sensory deficit. Coordination normal. GCS eye subscore is 4. GCS verbal subscore is 5. GCS motor subscore is 6.  Skin: Skin is warm and dry.    ED Course  Procedures (including critical care time) Labs Review Labs Reviewed  BASIC METABOLIC PANEL - Abnormal; Notable for the following:    Potassium 3.2 (*)    Glucose, Bld 154 (*)    All other components within normal limits  CBC   Imaging Review No results found.  EKG Interpretation    Date/Time:  Friday May 08 2013 18:27:18 EST Ventricular Rate:  90 PR  Interval:  172 QRS Duration: 98 QT Interval:  350 QTC Calculation: 428 R Axis:   135 Text Interpretation:  Normal sinus rhythm Right axis deviation Pulmonary disease pattern Nonspecific ST and T wave abnormality Abnormal ECG No significant change since last tracing Confirmed by ZACKOWSKI  MD, SCOTT ([id number]) on 05/08/2013 11:01:02 PM            MDM   67 y/o male with history as noted below who presents with syncopal like episodes. Now asymptomatic. Non-focal neurological exam. VSS and well appearing. Has seen neurology and is currently switching neurologist. Has had outpatient workup to exclude CVA, TIA and Seizures. EKG here normal. Labs with only mild hyperkalemia which was replaced. Unclear etiology but don't suspect acute neurologic emergency. Recommended continued outpatient workup with neurology. No driving or performing any dangerous activities until cleared by a physician (this was relayed to both the patient as well as his daughter and wife). Return precautions given and discussed with the patient who was in agreement with the plan.    1. Alteration consciousness       Shanon Ace, MD 05/09/13 [phone]

## 2013-05-08 NOTE — ED Notes (Signed)
EDP at bedside  

## 2013-05-22 DIAGNOSIS — E876 Hypokalemia: Secondary | ICD-10-CM | POA: Diagnosis not present

## 2013-05-22 DIAGNOSIS — R4182 Altered mental status, unspecified: Secondary | ICD-10-CM | POA: Diagnosis not present

## 2013-06-01 ENCOUNTER — Encounter: Payer: Self-pay | Admitting: Neurology

## 2013-06-01 ENCOUNTER — Ambulatory Visit (INDEPENDENT_AMBULATORY_CARE_PROVIDER_SITE_OTHER): Payer: Medicare Other | Admitting: Neurology

## 2013-06-01 VITALS — BP 120/70 | HR 78 | Temp 98.1°F | Ht 70.5 in | Wt 224.0 lb

## 2013-06-01 DIAGNOSIS — R29818 Other symptoms and signs involving the nervous system: Secondary | ICD-10-CM

## 2013-06-01 DIAGNOSIS — R295 Transient paralysis: Secondary | ICD-10-CM

## 2013-06-01 NOTE — Progress Notes (Signed)
NEUROLOGY CONSULTATION NOTE  GRANTHAM HIPPERT MRN: [id number] DOB: 12-20-1945  Referring provider: Dr. Rex Kras Primary care provider: Dr. Rex Kras  Reason for consult:  Transient mental status change.  HISTORY OF PRESENT ILLNESS: Justin Little is a 68 year old right-handed man with history of OSA, restless leg syndrome, hypertension and hyperlipidemia who presents for second opinion regarding episodic altered sensorium.  Records and images were personally reviewed where available.   The first spell occurred in June 2014.  He took his ropinirole 4mg  that night for his restless leg syndrome.  While taking to someone about an hour later, he became less responsive and closes his eyes, like he was in a sleep.  He was not sleepy and there is no warning.  He is conscious and could hear people talking to him but he was unable to respond.  His eyes are typically closed but he does recall once looking over at his wife.  He does not lose tone but he cannot move.  With effort, he attempts to move but can't.  There were no convulsions, focal abnormal movements, facial numbness, strange olfactory sensation, visual disturbance or sensation of epigastric rising.  It usually lasts less than 2 minutes, except on one or two occasions, it exceeded this.  A workup at the ED revealed normal CT of the brain and normal CBC and CMP.  ETOH level was 74.  He has had about six other spells since then.  Last spell was about 2 weeks ago.  One time, it occurred while playing cards with friends and another time it occurred while watching TV.  At first, it was thought to be a side effect of his ropinirole, which he takes for RLS, because it occurred about an hour after taking it.  However, subsequent spells occurred at different times of day and not right after taking the medication.  He has a history of OSA and uses a CPAP.  He feels rested.  No history of seizures.  He was seen by Dr. Krista Blue at [facility].  He had an EEG performed on 12/05/12, which was normal.  MRI of the brain with and without contrast was performed on 01/07/13, which revealed small vessel ischemic changes but no clinically significant abnormalities.  A carotid doppler was performed on 05/06/13, which revealed less than 50% stenosis of both ICAs, but nothing hemodynamically significant.  PAST MEDICAL HISTORY: Past Medical History  Diagnosis Date  . Sleep apnea   . Hypertension   . High cholesterol   . Restless leg syndrome   . Degenerative arthritis   . Restless leg syndrome   . High cholesterol     PAST SURGICAL HISTORY: Past Surgical History  Procedure Laterality Date  . Shoulder arthroscopy    . Tonsillectomy and adenoidectomy    . Colonoscopy      MEDICATIONS: Current Outpatient Prescriptions on File Prior to Visit  Medication Sig Dispense Refill  . amLODipine (NORVASC) 10 MG tablet Take 10 mg by mouth daily.      . benazepril-hydrochlorthiazide (LOTENSIN HCT) 20-12.5 MG per tablet Take 2 tablets by mouth daily.      . pravastatin (PRAVACHOL) 40 MG tablet Take 40 mg by mouth daily.      Marland Kitchen rOPINIRole (REQUIP) 4 MG tablet Take 4 mg by mouth at bedtime.       No current facility-administered medications on file prior to visit.    ALLERGIES: No Known Allergies  FAMILY HISTORY: Family History  Problem  Relation Age of Onset  . Heart Problems Father   . High blood pressure Mother   . Cancer Mother     SOCIAL HISTORY: History   Social History  . Marital Status: Married    Spouse Name: [name]    Number of Children: 2  . Years of Education: college   Occupational History  .      Sales   Social History Main Topics  . Smoking status: Former Smoker    Types: Cigarettes  . Smokeless tobacco: Never Used     Comment: Quit 1985  . Alcohol Use: 0.6 oz/week    1 Shots of liquor per week     Comment: daily scotch  . Drug Use: No  . Sexual Activity: Not on file   Other Topics Concern  .  Not on file   Social History Narrative   Patient lives at home with his wife [name]). Patient is still working works with Press photographer.    Right handed.   Caffeine - None    REVIEW OF SYSTEMS: Constitutional: No fevers, chills, or sweats, no generalized fatigue, change in appetite Eyes: No visual changes, double vision, eye pain Ear, nose and throat: No hearing loss, ear pain, nasal congestion, sore throat Cardiovascular: No chest pain, palpitations Respiratory:  No shortness of breath at rest or with exertion, wheezes GastrointestinaI: No nausea, vomiting, diarrhea, abdominal pain, fecal incontinence Genitourinary:  No dysuria, urinary retention or frequency Musculoskeletal:  No neck pain, back pain Integumentary: No rash, pruritus, skin lesions Neurological: as above Psychiatric: No depression, insomnia, anxiety Endocrine: No palpitations, fatigue, diaphoresis, mood swings, change in appetite, change in weight, increased thirst Hematologic/Lymphatic:  No anemia, purpura, petechiae. Allergic/Immunologic: no itchy/runny eyes, nasal congestion, recent allergic reactions, rashes  PHYSICAL EXAM: Filed Vitals:   06/01/13 1343  BP: 120/70  Pulse: 78  Temp: 98.1 F (36.7 C)   General: No acute distress Head:  Normocephalic/atraumatic Neck: supple, no paraspinal tenderness, full range of motion Back: No paraspinal tenderness Heart: regular rate and rhythm Lungs: Clear to auscultation bilaterally. Vascular: No carotid bruits. Neurological Exam: Mental status: alert and oriented to person, place, and time, speech fluent and not dysarthric, language intact. Cranial nerves: CN I: not tested CN II: pupils equal, round and reactive to light, visual fields intact, fundi unremarkable. CN III, IV, VI:  full range of motion, no nystagmus, no ptosis CN V: facial sensation intact CN VII: upper and lower face symmetric CN VIII: hearing intact CN IX, X: gag intact, uvula midline CN XI:  sternocleidomastoid and trapezius muscles intact CN XII: tongue midline Bulk & Tone: normal, no fasciculations. Motor: 5/5 throughout Sensation: temperature and vibration intact Deep Tendon Reflexes: 2+ throughout, toes down Finger to nose testing: normal Heel to shin: normal Gait: normal stance and stride.  Able to walk in tandem. Romberg negative.  IMPRESSION: Transient paralysis.  It is not really altered mental status, because he is lucid.  It is unusual presentation for seizure.  Not characteristic for a sleep disorder.  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  Likelihood is low, given that these spells have been ongoing for several months without a large stroke and he does not have any significant small vessel ischemic changes in the pons on MRI.  Otherwise, I am puzzled.  PLAN: CTA of head Follow up soon after.  45 minutes spent with  patient, over 50% spent counseling and coordinating care.  Thank you for allowing me to take part in the care of this patient.  Metta Clines, DO  CC:  Hulan Fess, MD

## 2013-06-01 NOTE — Patient Instructions (Addendum)
At this point, I am not really sure what is going on.  It doesn't seem like a seizure.  It doesn't seem to be related to a sleep disorder.  And I don't suspect it is related to the ropinirole.  To be complete, we will check an CTA of the head to look for any evidence of blockage or narrowing of the arteries that may contribute to this, although I would suspect if it was due to that, you would have had a large stroke already.  Follow up soon after.

## 2013-06-02 ENCOUNTER — Telehealth: Payer: Self-pay | Admitting: Neurology

## 2013-06-02 NOTE — Telephone Encounter (Signed)
Spoke with [employer]. Aware of CTA head scheduled at [facility] on 06/04/13 at 10:00 am; to arrive at 9:45 am. NPO 4 hours prior to the test (may have water only). Asked that they call if additional questions.

## 2013-06-04 ENCOUNTER — Encounter (HOSPITAL_COMMUNITY): Payer: Self-pay

## 2013-06-04 ENCOUNTER — Ambulatory Visit (HOSPITAL_COMMUNITY)
Admission: RE | Admit: 2013-06-04 | Discharge: 2013-06-04 | Disposition: A | Discharge status: Home / Self Care | Payer: Medicare Other | Source: Ambulatory Visit | Attending: Neurology | Admitting: Neurology

## 2013-06-04 DIAGNOSIS — G839 Paralytic syndrome, unspecified: Secondary | ICD-10-CM | POA: Insufficient documentation

## 2013-06-04 DIAGNOSIS — R29818 Other symptoms and signs involving the nervous system: Secondary | ICD-10-CM | POA: Diagnosis not present

## 2013-06-04 DIAGNOSIS — R295 Transient paralysis: Secondary | ICD-10-CM

## 2013-06-04 MED ORDER — IOHEXOL 350 MG/ML SOLN
80.0000 mL | Freq: Once | INTRAVENOUS | Status: AC | PRN
  Administered 2013-06-04: 80 mL via INTRAVENOUS

## 2013-06-08 ENCOUNTER — Telehealth: Payer: Self-pay | Admitting: *Deleted

## 2013-06-08 NOTE — Telephone Encounter (Signed)
Message copied by Claudie Revering on Mon Jun 08, 2013  4:20 PM ------      Message from: JAFFE, ADAM R      Created: Mon Jun 08, 2013  8:31 AM       Please let Mr. Petersheim know that the CT of the intracranial arteries looks okay.        ARJ      ----- Message -----         From: Rad Results In Interface         Sent: 06/04/2013   1:40 PM           To: Dudley Major, DO                   ------

## 2013-07-01 DIAGNOSIS — G2589 Other specified extrapyramidal and movement disorders: Secondary | ICD-10-CM | POA: Diagnosis not present

## 2013-07-01 DIAGNOSIS — G4733 Obstructive sleep apnea (adult) (pediatric): Secondary | ICD-10-CM | POA: Diagnosis not present

## 2013-08-01 ENCOUNTER — Emergency Department (HOSPITAL_COMMUNITY): Payer: Medicare Other

## 2013-08-01 ENCOUNTER — Inpatient Hospital Stay (HOSPITAL_COMMUNITY): Payer: Medicare Other

## 2013-08-01 ENCOUNTER — Inpatient Hospital Stay (HOSPITAL_COMMUNITY)
Admission: EM | Admit: 2013-08-01 | Discharge: 2013-08-03 | DRG: 871 | Disposition: A | Discharge status: Home / Self Care | Payer: Medicare Other | Attending: Internal Medicine | Admitting: Internal Medicine

## 2013-08-01 ENCOUNTER — Encounter (HOSPITAL_COMMUNITY): Payer: Self-pay | Admitting: Emergency Medicine

## 2013-08-01 DIAGNOSIS — J189 Pneumonia, unspecified organism: Secondary | ICD-10-CM

## 2013-08-01 DIAGNOSIS — J9600 Acute respiratory failure, unspecified whether with hypoxia or hypercapnia: Secondary | ICD-10-CM | POA: Diagnosis present

## 2013-08-01 DIAGNOSIS — I214 Non-ST elevation (NSTEMI) myocardial infarction: Secondary | ICD-10-CM | POA: Diagnosis not present

## 2013-08-01 DIAGNOSIS — E872 Acidosis, unspecified: Secondary | ICD-10-CM | POA: Diagnosis present

## 2013-08-01 DIAGNOSIS — R7989 Other specified abnormal findings of blood chemistry: Secondary | ICD-10-CM

## 2013-08-01 DIAGNOSIS — I10 Essential (primary) hypertension: Secondary | ICD-10-CM | POA: Diagnosis not present

## 2013-08-01 DIAGNOSIS — E860 Dehydration: Secondary | ICD-10-CM | POA: Diagnosis present

## 2013-08-01 DIAGNOSIS — G2581 Restless legs syndrome: Secondary | ICD-10-CM | POA: Diagnosis not present

## 2013-08-01 DIAGNOSIS — R0602 Shortness of breath: Secondary | ICD-10-CM | POA: Diagnosis not present

## 2013-08-01 DIAGNOSIS — M1990 Unspecified osteoarthritis, unspecified site: Secondary | ICD-10-CM

## 2013-08-01 DIAGNOSIS — G4733 Obstructive sleep apnea (adult) (pediatric): Secondary | ICD-10-CM | POA: Diagnosis present

## 2013-08-01 DIAGNOSIS — Z8673 Personal history of transient ischemic attack (TIA), and cerebral infarction without residual deficits: Secondary | ICD-10-CM

## 2013-08-01 DIAGNOSIS — A419 Sepsis, unspecified organism: Principal | ICD-10-CM

## 2013-08-01 DIAGNOSIS — R778 Other specified abnormalities of plasma proteins: Secondary | ICD-10-CM | POA: Diagnosis present

## 2013-08-01 DIAGNOSIS — E785 Hyperlipidemia, unspecified: Secondary | ICD-10-CM | POA: Diagnosis present

## 2013-08-01 DIAGNOSIS — E7800 Pure hypercholesterolemia, unspecified: Secondary | ICD-10-CM

## 2013-08-01 DIAGNOSIS — Z87891 Personal history of nicotine dependence: Secondary | ICD-10-CM | POA: Diagnosis not present

## 2013-08-01 DIAGNOSIS — M7989 Other specified soft tissue disorders: Secondary | ICD-10-CM | POA: Diagnosis not present

## 2013-08-01 DIAGNOSIS — I959 Hypotension, unspecified: Secondary | ICD-10-CM | POA: Diagnosis not present

## 2013-08-01 DIAGNOSIS — N179 Acute kidney failure, unspecified: Secondary | ICD-10-CM | POA: Diagnosis not present

## 2013-08-01 DIAGNOSIS — N289 Disorder of kidney and ureter, unspecified: Secondary | ICD-10-CM | POA: Diagnosis not present

## 2013-08-01 DIAGNOSIS — R6521 Severe sepsis with septic shock: Secondary | ICD-10-CM

## 2013-08-01 DIAGNOSIS — Z452 Encounter for adjustment and management of vascular access device: Secondary | ICD-10-CM | POA: Diagnosis not present

## 2013-08-01 DIAGNOSIS — I517 Cardiomegaly: Secondary | ICD-10-CM | POA: Diagnosis not present

## 2013-08-01 DIAGNOSIS — R404 Transient alteration of awareness: Secondary | ICD-10-CM

## 2013-08-01 DIAGNOSIS — Z7901 Long term (current) use of anticoagulants: Secondary | ICD-10-CM | POA: Diagnosis not present

## 2013-08-01 DIAGNOSIS — J9819 Other pulmonary collapse: Secondary | ICD-10-CM | POA: Diagnosis not present

## 2013-08-01 DIAGNOSIS — R799 Abnormal finding of blood chemistry, unspecified: Secondary | ICD-10-CM | POA: Diagnosis not present

## 2013-08-01 DIAGNOSIS — Z8249 Family history of ischemic heart disease and other diseases of the circulatory system: Secondary | ICD-10-CM | POA: Diagnosis not present

## 2013-08-01 DIAGNOSIS — R6520 Severe sepsis without septic shock: Secondary | ICD-10-CM | POA: Diagnosis present

## 2013-08-01 LAB — CBC
HCT: 52.3 % — ABNORMAL HIGH (ref 39.0–52.0)
Hemoglobin: 17.9 g/dL — ABNORMAL HIGH (ref 13.0–17.0)
MCH: 33.6 pg (ref 26.0–34.0)
MCHC: 34.2 g/dL (ref 30.0–36.0)
MCV: 98.3 fL (ref 78.0–100.0)
PLATELETS: 189 10*3/uL (ref 150–400)
RBC: 5.32 MIL/uL (ref 4.22–5.81)
RDW: 13.3 % (ref 11.5–15.5)
WBC: 10.7 10*3/uL — ABNORMAL HIGH (ref 4.0–10.5)

## 2013-08-01 LAB — PROTIME-INR
INR: 1.84 — ABNORMAL HIGH (ref 0.00–1.49)
INR: 1.3 (ref 0.00–1.49)
PROTHROMBIN TIME: 20.7 s — AB (ref 11.6–15.2)
Prothrombin Time: 15.9 seconds — ABNORMAL HIGH (ref 11.6–15.2)

## 2013-08-01 LAB — COMPREHENSIVE METABOLIC PANEL
ALT: 2029 U/L — ABNORMAL HIGH (ref 0–53)
AST: 1980 U/L — ABNORMAL HIGH (ref 0–37)
Albumin: 3.6 g/dL (ref 3.5–5.2)
Alkaline Phosphatase: 59 U/L (ref 39–117)
BUN: 24 mg/dL — AB (ref 6–23)
CALCIUM: 8.9 mg/dL (ref 8.4–10.5)
CO2: 25 meq/L (ref 19–32)
CREATININE: 3.03 mg/dL — AB (ref 0.50–1.35)
Chloride: 98 mEq/L (ref 96–112)
GFR, EST AFRICAN AMERICAN: 23 mL/min — AB (ref >90)
GFR, EST NON AFRICAN AMERICAN: 20 mL/min — AB (ref >90)
GLUCOSE: 164 mg/dL — AB (ref 70–99)
Potassium: 4.3 mEq/L (ref 3.7–5.3)
Sodium: 146 mEq/L (ref 137–147)
TOTAL PROTEIN: 7.2 g/dL (ref 6.0–8.3)
Total Bilirubin: 0.5 mg/dL (ref 0.3–1.2)

## 2013-08-01 LAB — ACETAMINOPHEN LEVEL

## 2013-08-01 LAB — I-STAT CG4 LACTIC ACID, ED
Lactic Acid, Venous: 6.21 mmol/L — ABNORMAL HIGH (ref 0.5–2.2)
Lactic Acid, Venous: 1.15 mmol/L (ref 0.5–2.2)

## 2013-08-01 LAB — I-STAT CHEM 8, ED
BUN: 26 mg/dL — AB (ref 6–23)
Calcium, Ion: 1.07 mmol/L — ABNORMAL LOW (ref 1.13–1.30)
Chloride: 99 mEq/L (ref 96–112)
Creatinine, Ser: 3.3 mg/dL — ABNORMAL HIGH (ref 0.50–1.35)
Glucose, Bld: 159 mg/dL — ABNORMAL HIGH (ref 70–99)
HCT: 55 % — ABNORMAL HIGH (ref 39.0–52.0)
Hemoglobin: 18.7 g/dL — ABNORMAL HIGH (ref 13.0–17.0)
Potassium: 4 mEq/L (ref 3.7–5.3)
SODIUM: 144 meq/L (ref 137–147)
TCO2: 28 mmol/L (ref 0–100)

## 2013-08-01 LAB — EXPECTORATED SPUTUM ASSESSMENT W REFEX TO RESP CULTURE

## 2013-08-01 LAB — MRSA PCR SCREENING: MRSA BY PCR: NEGATIVE

## 2013-08-01 LAB — PROCALCITONIN
Procalcitonin: 0.98 ng/mL
Procalcitonin: 2 ng/mL

## 2013-08-01 LAB — CARBOXYHEMOGLOBIN
Carboxyhemoglobin: 1.7 % — ABNORMAL HIGH (ref 0.5–1.5)
Methemoglobin: 1.4 % (ref 0.0–1.5)
O2 Saturation: 76.2 %
Total hemoglobin: 15.6 g/dL (ref 13.5–18.0)

## 2013-08-01 LAB — INFLUENZA PANEL BY PCR (TYPE A & B)
H1N1FLUPCR: NOT DETECTED
Influenza A By PCR: NEGATIVE
Influenza B By PCR: NEGATIVE

## 2013-08-01 LAB — STREP PNEUMONIAE URINARY ANTIGEN: Strep Pneumo Urinary Antigen: NEGATIVE

## 2013-08-01 LAB — TROPONIN I
Troponin I: 1.06 ng/mL (ref <0.30)
Troponin I: 1.99 ng/mL (ref <0.30)
Troponin I: 2.76 ng/mL (ref <0.30)

## 2013-08-01 LAB — I-STAT TROPONIN, ED: Troponin i, poc: 0.43 ng/mL (ref 0.00–0.08)

## 2013-08-01 LAB — APTT: APTT: 42 s — AB (ref 24–37)

## 2013-08-01 LAB — EXPECTORATED SPUTUM ASSESSMENT W GRAM STAIN, RFLX TO RESP C: Special Requests: NORMAL

## 2013-08-01 LAB — LIPASE, BLOOD: Lipase: 17 U/L (ref 11–59)

## 2013-08-01 LAB — PHOSPHORUS: Phosphorus: 3.4 mg/dL (ref 2.3–4.6)

## 2013-08-01 LAB — MAGNESIUM: Magnesium: 1.1 mg/dL — ABNORMAL LOW (ref 1.5–2.5)

## 2013-08-01 LAB — CORTISOL: Cortisol, Plasma: 23.1 ug/dL

## 2013-08-01 LAB — AMMONIA: Ammonia: 34 umol/L (ref 11–60)

## 2013-08-01 MED ORDER — SODIUM CHLORIDE 0.9 % IJ SOLN
10.0000 mL | Freq: Two times a day (BID) | INTRAMUSCULAR | Status: DC
  Administered 2013-08-01: 10 mL
  Administered 2013-08-02: 40 mL
  Administered 2013-08-03: 10 mL

## 2013-08-01 MED ORDER — HEPARIN BOLUS VIA INFUSION
5000.0000 [IU] | Freq: Once | INTRAVENOUS | Status: AC
  Administered 2013-08-01: 5000 [IU] via INTRAVENOUS
  Filled 2013-08-01: qty 5000

## 2013-08-01 MED ORDER — NOREPINEPHRINE BITARTRATE 1 MG/ML IJ SOLN
5.0000 ug/min | INTRAVENOUS | Status: DC
  Administered 2013-08-01: 6 ug/min via INTRAVENOUS
  Administered 2013-08-01: 8 ug/min via INTRAVENOUS
  Filled 2013-08-01: qty 4

## 2013-08-01 MED ORDER — SODIUM CHLORIDE 0.9 % IJ SOLN
3.0000 mL | INTRAMUSCULAR | Status: DC | PRN
  Administered 2013-08-02: 3 mL via INTRAVENOUS

## 2013-08-01 MED ORDER — DEXTROSE 5 % IV SOLN
500.0000 mg | INTRAVENOUS | Status: DC
  Administered 2013-08-02: 500 mg via INTRAVENOUS
  Filled 2013-08-01 (×2): qty 500

## 2013-08-01 MED ORDER — DEXTROSE 5 % IV SOLN: 500.0000 mg | INTRAVENOUS | Status: DC

## 2013-08-01 MED ORDER — SODIUM CHLORIDE 0.9 % IV BOLUS (SEPSIS): 1000.0000 mL | INTRAVENOUS | Status: DC | PRN

## 2013-08-01 MED ORDER — DEXTROSE 5 % IV SOLN
500.0000 mg | Freq: Once | INTRAVENOUS | Status: AC
  Administered 2013-08-01: 500 mg via INTRAVENOUS

## 2013-08-01 MED ORDER — HEPARIN (PORCINE) IN NACL 100-0.45 UNIT/ML-% IJ SOLN
1750.0000 [IU]/h | INTRAMUSCULAR | Status: DC
  Administered 2013-08-01: 1500 [IU]/h via INTRAVENOUS
  Administered 2013-08-02: 1600 [IU]/h via INTRAVENOUS
  Filled 2013-08-01 (×3): qty 250

## 2013-08-01 MED ORDER — SODIUM CHLORIDE 0.9 % IV BOLUS (SEPSIS)
1000.0000 mL | Freq: Once | INTRAVENOUS | Status: AC
  Administered 2013-08-01: 1000 mL via INTRAVENOUS

## 2013-08-01 MED ORDER — CEFTRIAXONE SODIUM 1 G IJ SOLR
1.0000 g | Freq: Once | INTRAMUSCULAR | Status: AC
  Administered 2013-08-01: 1 g via INTRAVENOUS
  Filled 2013-08-01: qty 10

## 2013-08-01 MED ORDER — SODIUM CHLORIDE 0.9 % IV BOLUS (SEPSIS)
1000.0000 mL | Freq: Once | INTRAVENOUS | Status: AC
  Administered 2013-08-01 (×2): 1000 mL via INTRAVENOUS

## 2013-08-01 MED ORDER — DEXTROSE 5 % IV SOLN: 1.0000 g | INTRAVENOUS | Status: DC

## 2013-08-01 MED ORDER — ROPINIROLE HCL 1 MG PO TABS
4.0000 mg | ORAL_TABLET | Freq: Every day | ORAL | Status: DC
  Administered 2013-08-01 – 2013-08-02 (×2): 4 mg via ORAL
  Filled 2013-08-01 (×3): qty 4

## 2013-08-01 MED ORDER — SODIUM CHLORIDE 0.9 % IV SOLN
INTRAVENOUS | Status: DC
  Administered 2013-08-01: 15:00:00 via INTRAVENOUS
  Administered 2013-08-01 – 2013-08-02 (×2): 150 mL/h via INTRAVENOUS
  Administered 2013-08-02: 12:00:00 via INTRAVENOUS

## 2013-08-01 MED ORDER — IPRATROPIUM BROMIDE 0.02 % IN SOLN
0.5000 mg | Freq: Once | RESPIRATORY_TRACT | Status: AC
  Administered 2013-08-01: 0.5 mg via RESPIRATORY_TRACT
  Filled 2013-08-01: qty 2.5

## 2013-08-01 MED ORDER — SODIUM CHLORIDE 0.9 % IV SOLN
1000.0000 mL | Freq: Once | INTRAVENOUS | Status: AC
  Administered 2013-08-01: 1000 mL via INTRAVENOUS

## 2013-08-01 MED ORDER — ASPIRIN EC 81 MG PO TBEC
81.0000 mg | DELAYED_RELEASE_TABLET | Freq: Every day | ORAL | Status: DC
  Administered 2013-08-02 – 2013-08-03 (×2): 81 mg via ORAL
  Filled 2013-08-01 (×2): qty 1

## 2013-08-01 MED ORDER — PHENYLEPHRINE HCL 10 MG/ML IJ SOLN
20.0000 ug/min | INTRAVENOUS | Status: DC
  Filled 2013-08-01: qty 1

## 2013-08-01 MED ORDER — ALBUTEROL SULFATE (2.5 MG/3ML) 0.083% IN NEBU
5.0000 mg | INHALATION_SOLUTION | Freq: Once | RESPIRATORY_TRACT | Status: AC
  Administered 2013-08-01: 5 mg via RESPIRATORY_TRACT
  Filled 2013-08-01: qty 6

## 2013-08-01 MED ORDER — VASOPRESSIN 20 UNIT/ML IJ SOLN
0.0300 [IU]/min | INTRAVENOUS | Status: DC
  Filled 2013-08-01: qty 2.5

## 2013-08-01 MED ORDER — NOREPINEPHRINE BITARTRATE 1 MG/ML IJ SOLN
2.0000 ug/min | INTRAVENOUS | Status: DC
  Administered 2013-08-01: 20 ug/min via INTRAVENOUS
  Filled 2013-08-01: qty 4

## 2013-08-01 MED ORDER — PANTOPRAZOLE SODIUM 40 MG IV SOLR
40.0000 mg | Freq: Every day | INTRAVENOUS | Status: DC
  Administered 2013-08-01: 40 mg via INTRAVENOUS
  Filled 2013-08-01 (×2): qty 40

## 2013-08-01 MED ORDER — ASPIRIN 81 MG PO CHEW
324.0000 mg | CHEWABLE_TABLET | Freq: Once | ORAL | Status: AC
  Administered 2013-08-01: 324 mg via ORAL
  Filled 2013-08-01: qty 4

## 2013-08-01 MED ORDER — SODIUM CHLORIDE 0.9 % IJ SOLN: 10.0000 mL | INTRAMUSCULAR | Status: DC | PRN

## 2013-08-01 MED ORDER — ATORVASTATIN CALCIUM 80 MG PO TABS
80.0000 mg | ORAL_TABLET | Freq: Every day | ORAL | Status: DC
  Filled 2013-08-01: qty 1

## 2013-08-01 MED ORDER — DOBUTAMINE IN D5W 4-5 MG/ML-% IV SOLN: 2.5000 ug/kg/min | INTRAVENOUS | Status: DC

## 2013-08-01 MED ORDER — DEXTROSE 5 % IV SOLN
2.0000 g | INTRAVENOUS | Status: DC
  Administered 2013-08-02 – 2013-08-03 (×2): 2 g via INTRAVENOUS
  Filled 2013-08-01 (×2): qty 2

## 2013-08-01 MED ORDER — SODIUM CHLORIDE 0.9 % IJ SOLN
3.0000 mL | Freq: Two times a day (BID) | INTRAMUSCULAR | Status: DC
  Administered 2013-08-01 – 2013-08-02 (×2): 3 mL via INTRAVENOUS

## 2013-08-01 NOTE — ED Notes (Signed)
Pickering, MD at bedside.  

## 2013-08-01 NOTE — Progress Notes (Signed)
eLink Physician-Brief Progress Note Patient Name: Justin Little DOB: October 24, 1945 MRN: [id number]  Date of Service  08/01/2013   HPI/Events of Note   Patient requesting home requip Rx.  eICU Interventions   Provided.   Intervention Category Minor Interventions: Routine modifications to care plan (e.g. PRN medications for pain, fever)  Brookley Spitler R. 08/01/2013, 9:21 PM

## 2013-08-01 NOTE — ED Notes (Signed)
Dr. Pickering at the bedside.  

## 2013-08-01 NOTE — Procedures (Signed)
Supervised procedure @ bedside. Real time 2D ultrasound used for vein site selection, patency assessment, and needle entry.  A record of image was made but could not be submitted for filing due to malfunction of printing device   Dr. Brand Males, M.D., [facility].C.P Pulmonary and Critical Care Medicine Staff Physician [facility] Pager: [phone], If no answer or between  15:00h - 7:00h: call [phone]  08/01/2013 5:49 PM

## 2013-08-01 NOTE — H&P (Addendum)
PULMONARY / CRITICAL CARE MEDICINE   Name: Justin Little MRN: [id number] DOB: [birth year]    ADMISSION DATE:  08/01/2013   REFERRING MD :  EDP PRIMARY SERVICE: PCCM  CHIEF COMPLAINT:  SOB  BRIEF PATIENT DESCRIPTION: 68 yo with hypotension, fever, one episode of blood tinged sputum(on plavix) acute renal insuff (creatine 3.3 nl 1.00) lactic acid of >6. He notes general malaise, fever, cough, low urine output over 6 days. He recently went on a cruise to the [place] and was exposed to his brother with similar symptoms. Chest x ray ? rll process(could be Atx) . He is a remote smoker, ++OSA and compliant with with cpap. Of note he was worked up by neurology last year for transient paralysis with negative CT/MRI/Cartoid studies. Although he looks better than his numbers, PCCM will admit to ICU and treat with aggressive IVF, Abx,close monitoring along with O2 and Cpap(OSA).  Further note mildly + trop I, we will follow serial troponin's for completeness.   SIGNIFICANT EVENTS / STUDIES:   08/01/2013 - Admiot   LINES / TUBES: Rt subclavian line 08/01/2013   CULTURES: 3.14 bc x 2>> 3/14 uc>> 3/14 sputum 08/01/13 - resp virus panel  3/15 urine legionella 3/15 urine strep  ANTIBIOTICS: 3/14 roc>> 3/14 zithro>>  HISTORY OF PRESENT ILLNESS:    68 yo with hypotension, fever, one episode of blood tinged sputum(on plavix) acute renal insuff (creatine 3.3 nl 1.00) lactic acid of >6. He notes general malaise, fever, cough, low urine output over 6 days. He recently went on a cruise to the [place] and was exposed to his brother with similar symptoms. Chest x ray ? rll process(could be Atx) . He is a remote smoker, ++OSA and compliant with with cpap. Of note he was worked up by neurology last year for transient paralysis with negative CT/MRI/Cartoid studies. Although he looks better than his numbers, PCCM will admit to ICU and treat with aggressive IVF, Abx,close monitoring along with O2 and  Cpap(OSA).  Further note mildly + trop I, we will follow serial troponin's for completeness.  PAST MEDICAL HISTORY :  Past Medical History  Diagnosis Date  . Sleep apnea   . Hypertension   . High cholesterol   . Restless leg syndrome   . Degenerative arthritis   . Restless leg syndrome   . High cholesterol    Past Surgical History  Procedure Laterality Date  . Shoulder arthroscopy    . Tonsillectomy and adenoidectomy    . Colonoscopy     Prior to Admission medications   Medication Sig Start Date End Date Taking? Authorizing Provider  amLODipine (NORVASC) 10 MG tablet Take 10 mg by mouth daily.   Yes Historical Provider, MD  benazepril-hydrochlorthiazide (LOTENSIN HCT) 20-12.5 MG per tablet Take 2 tablets by mouth daily.   Yes Historical Provider, MD  clopidogrel (PLAVIX) 75 MG tablet Take 75 mg by mouth daily with breakfast.   Yes Historical Provider, MD  potassium chloride (K-DUR) 10 MEQ tablet Take 10 mEq by mouth daily.   Yes Historical Provider, MD  pravastatin (PRAVACHOL) 40 MG tablet Take 40 mg by mouth daily.   Yes Historical Provider, MD  rOPINIRole (REQUIP) 4 MG tablet Take 4 mg by mouth at bedtime.   Yes Historical Provider, MD   No Known Allergies  FAMILY HISTORY:  Family History  Problem Relation Age of Onset  . Heart Problems Father   . High blood pressure Mother   . Cancer Mother    SOCIAL HISTORY:  reports that he has quit smoking. His smoking use included Cigarettes. He smoked 0.00 packs per day. He has never used smokeless tobacco. He reports that he drinks about 0.6 ounces of alcohol per week. He reports that he does not use illicit drugs.  REVIEW OF SYSTEMS:   10 point review of system taken, please see HPI for positives and negatives.   SUBJECTIVE:   VITAL SIGNS: Temp:  [98.3 F (36.8 C)] 98.3 F (36.8 C) (03/14 1040) Pulse Rate:  [103-116] 110 (03/14 1248) Resp:  [14-23] 22 (03/14 1248) BP: (72-130)/(48-104) 130/104 mmHg (03/14 1248) SpO2:   [91 %-100 %] 99 % (03/14 1248) Weight:  [102.059 kg (225 lb)] 102.059 kg (225 lb) (03/14 1035) HEMODYNAMICS:   VENTILATOR SETTINGS:   INTAKE / OUTPUT: Intake/Output   None     PHYSICAL EXAMINATION: General:  [id number] nice tan Neuro:Intact HEENT:  No LAN/JVD Cardiovascular:  HSR RRR Lungs:  Mild rhonchi Abdomen: Soft nt +bs Musculoskeletal:  intact Skin:  warm  LABS:  PULMONARY  Recent Labs Lab 08/01/13 1105  TCO2 28    CBC  Recent Labs Lab 08/01/13 1051 08/01/13 1105  HGB 17.9* 18.7*  HCT 52.3* 55.0*  WBC 10.7*  --   PLT 189  --     COAGULATION No results found for this basename: INR,  in the last 168 hours  CARDIAC  No results found for this basename: TROPONINI,  in the last 168 hours No results found for this basename: PROBNP,  in the last 168 hours   CHEMISTRY  Recent Labs Lab 08/01/13 1052 08/01/13 1105  NA 146 144  K 4.3 4.0  CL 98 99  CO2 25  --   GLUCOSE 164* 159*  BUN 24* 26*  CREATININE 3.03* 3.30*  CALCIUM 8.9  --    Estimated Creatinine Clearance: 26.4 ml/min (by C-G formula based on Cr of 3.3).   LIVER  Recent Labs Lab 08/01/13 1052  AST 1980*  ALT 2029*  ALKPHOS 59  BILITOT 0.5  PROT 7.2  ALBUMIN 3.6     INFECTIOUS  Recent Labs Lab 08/01/13 1106 08/01/13 1325 08/01/13 1409  LATICACIDVEN 6.21*  --  1.15  PROCALCITON  --  0.98  --      ENDOCRINE CBG (last 3)  No results found for this basename: GLUCAP,  in the last 72 hours       IMAGING x48h  Dg Chest Port 1 View  08/01/2013   CLINICAL DATA:  Shortness of breath  EXAM: PORTABLE CHEST - 1 VIEW  COMPARISON:  None  FINDINGS: Cardiac shadow is within normal limits. The lungs are well aerated. Minimal platelike atelectasis is noted in the right mid lung. No focal confluent infiltrate is seen.  IMPRESSION: Right lung atelectasis.   Electronically Signed   By: Inez Catalina M.D.   On: 08/01/2013 11:26      ASSESSMENT / PLAN:  PULMONARY A:   Baseline OSA on CPAP,  Recent cruise, Sick Contact Now with acute hypoxemia, RLL atelectasis, hemoptysis; on 35% face mask: CAP (soem findings are not convincing) v ? PE   P:   Face mask o2 for pusle ox >88% ABX Hold  Plavix for now IV heparin gtt for possible PE (get duplex and echo for PE Evidence in setting of ATN) CPAP QHS   CARDIOVASCULAR A: Hypotension , most likely multifactorial, infectious state, dehydration, continued diuretics. Could be sepsis.     Mildly + trop   P:  Heparin for presumed PE  Stat echo EGDT protocol phase 2 Fluid resuscitation Stop antihypertensives/diurectics Serial trop x 24 hours Repeat 12 lead 3/15 am   RENAL A: Acute renal failure, complicated by dehydration and continued use of antihypertensives and diuretics.    Lactic acid >6 P:   Stop HCTZ and antihypertensives Fluid resuscitation Bladder scan(160 cc), may need renal US if no response if IVF Follow lactic acid for clearing.  GASTROINTESTINAL A:  GI protection P:   PPI  HEMATOLOGIC A: Findings  consistent with hemoconcentration  P:  Monitor cbc post resusitation  INFECTIOUS A:  Presumed CAP v PE P:   See flows  ENDOCRINE A: No Acute Isuuse  P:     NEUROLOGIC A:  Hx of transient paralysis chronic itnermittent. Followed by Dr Tomi Likens. No cause found Normal affect currently Hx of restless leg - on repinrole P:   ICU monitoring  Hold repinrole due to renal failure   GLOBAL 08/01/13: wife, patient and daughter updated in detail.. Explained risk, benefits of heparin  :   Richardson Landry Minor ACNP Maryanna Shape PCCM Pager [phone] till 3 pm If no answer page [phone] 08/01/2013, 1:19 PM    STAFF NOTE: I, Dr Ann Lions have personally reviewed patient's available data, including medical history, events of note, physical examination and test results as part of my evaluation. I have discussed with resident/NP and other care providers such as pharmacist, RN and RRT.  In addition,  I  personally evaluated patient and elicited key findings of Reviewed hx. I am also concerned abou possible PE given cruise, obesity, fiding of only RLL atlectasis with hypoxemia that is out of proportion. Will move to ICU. Add heparin IV gtt for PE and get duplex LE and Echo. Continue CAP rx. Monitor closely. STart EGDT sepsis protocol.  Him and wife updated.Marland Kitchen  Rest per NP/medical resident whose note is outlined above and that I agree with  The patient is critically ill with multiple organ systems failure and requires high complexity decision making for assessment and support, frequent evaluation and titration of therapies, application of advanced monitoring technologies and extensive interpretation of multiple databases.   Critical Care Time devoted to patient care services described in this note is  45  Minutes.  Dr. Brand Males, M.D., [facility].C.P Pulmonary and Critical Care Medicine Staff Physician [facility] Pager: [phone], If no answer or between  15:00h - 7:00h: call [phone]  08/01/2013 3:50 PM

## 2013-08-01 NOTE — Progress Notes (Signed)
Pt declined to wear CPAP tonight.  Pt said he would prefer to wear the nasal cannula tonight.

## 2013-08-01 NOTE — ED Provider Notes (Signed)
CSN: [id number]     Arrival date & time 08/01/13  1022 History   First MD Initiated Contact with Patient 08/01/13 1043     Chief Complaint  Patient presents with  . Shortness of Breath     (Consider location/radiation/quality/duration/timing/severity/associated sxs/prior Treatment) Patient is a 68 y.o. male presenting with shortness of breath. The history is provided by the patient.  Shortness of Breath Severity:  Moderate Associated symptoms: abdominal pain, chest pain and fever   Associated symptoms: no rash    patient presents with shortness of breath and altered mental status. He is reportedly had a cough for the last few days and took some of his wife's hydrocodone cough syrup yesterday. He also takes a day before. He was unresponsive this morning. EMS arrived and found to be hypotensive with saturations at 75%. He was still somewhat confused. He states he has had chills. He's had a cough productive of some sputum. He has had chest abdominal pain, associated with the cough. No diarrhea or constipation.  Past Medical History  Diagnosis Date  . Sleep apnea   . Hypertension   . High cholesterol   . Restless leg syndrome   . Degenerative arthritis   . Restless leg syndrome   . High cholesterol    Past Surgical History  Procedure Laterality Date  . Shoulder arthroscopy    . Tonsillectomy and adenoidectomy    . Colonoscopy     Family History  Problem Relation Age of Onset  . Heart Problems Father   . High blood pressure Mother   . Cancer Mother    History  Substance Use Topics  . Smoking status: Former Smoker    Types: Cigarettes  . Smokeless tobacco: Never Used     Comment: Quit 1985  . Alcohol Use: 0.6 oz/week    1 Shots of liquor per week     Comment: daily scotch    Review of Systems  Constitutional: Positive for fever, chills and appetite change.  Respiratory: Positive for shortness of breath.   Cardiovascular: Positive for chest pain.  Gastrointestinal:  Positive for abdominal pain.  Genitourinary: Negative for urgency, flank pain and decreased urine volume.  Musculoskeletal: Negative for back pain.  Skin: Negative for rash and wound.  Neurological: Positive for light-headedness.  Psychiatric/Behavioral: Positive for confusion.      Allergies  Review of patient's allergies indicates no known allergies.  Home Medications   Current Outpatient Rx  Name  Route  Sig  Dispense  Refill  . amLODipine (NORVASC) 10 MG tablet   Oral   Take 10 mg by mouth daily.         . benazepril-hydrochlorthiazide (LOTENSIN HCT) 20-12.5 MG per tablet   Oral   Take 2 tablets by mouth daily.         . clopidogrel (PLAVIX) 75 MG tablet   Oral   Take 75 mg by mouth daily with breakfast.         . potassium chloride (K-DUR) 10 MEQ tablet   Oral   Take 10 mEq by mouth daily.         . pravastatin (PRAVACHOL) 40 MG tablet   Oral   Take 40 mg by mouth daily.         Marland Kitchen rOPINIRole (REQUIP) 4 MG tablet   Oral   Take 4 mg by mouth at bedtime.          BP 97/59  Pulse 106  Temp(Src) 98.3 F (36.8 C) (Oral)  Resp 17  Ht  (1.803 m)  Wt 225 lb (102.059 kg)  BMI 31.39 kg/m2  SpO2 94% Physical Exam  Constitutional: He is oriented to person, place, and time. He appears well-developed.  Face is flushed  HENT:  Head: Normocephalic.  Eyes: Pupils are equal, round, and reactive to light.  Neck: Normal range of motion. Neck supple.  Cardiovascular:  Tachycardia  Pulmonary/Chest:  Mild diffuse wheezes and prolonged expirations. Worse at left base. Some rhonchi at the left base  Abdominal: Soft. There is no tenderness.  Musculoskeletal: He exhibits no edema.  Neurological: He is alert and oriented to person, place, and time.  Skin: Skin is warm and dry.  Psychiatric: He has a normal mood and affect.    ED Course  Procedures (including critical care time) Labs Review Labs Reviewed  CBC - Abnormal; Notable for the following:     WBC 10.7 (*)    Hemoglobin 17.9 (*)    HCT 52.3 (*)    All other components within normal limits  COMPREHENSIVE METABOLIC PANEL - Abnormal; Notable for the following:    Glucose, Bld 164 (*)    BUN 24 (*)    Creatinine, Ser 3.03 (*)    AST 1980 (*)    ALT 2029 (*)    GFR calc non Af Amer 20 (*)    GFR calc Af Amer 23 (*)    All other components within normal limits  I-STAT TROPOININ, ED - Abnormal; Notable for the following:    Troponin i, poc 0.43 (*)    All other components within normal limits  I-STAT CG4 LACTIC ACID, ED - Abnormal; Notable for the following:    Lactic Acid, Venous 6.21 (*)    All other components within normal limits  I-STAT CHEM 8, ED - Abnormal; Notable for the following:    BUN 26 (*)    Creatinine, Ser 3.30 (*)    Glucose, Bld 159 (*)    Calcium, Ion 1.07 (*)    Hemoglobin 18.7 (*)    HCT 55.0 (*)    All other components within normal limits  CULTURE, BLOOD (ROUTINE X 2)  CULTURE, BLOOD (ROUTINE X 2)  AMMONIA  LIPASE, BLOOD   Imaging Review Dg Chest Port 1 View  08/01/2013   CLINICAL DATA:  Shortness of breath  EXAM: PORTABLE CHEST - 1 VIEW  COMPARISON:  None  FINDINGS: Cardiac shadow is within normal limits. The lungs are well aerated. Minimal platelike atelectasis is noted in the right mid lung. No focal confluent infiltrate is seen.  IMPRESSION: Right lung atelectasis.   Electronically Signed   By: Alcide Clever M.D.   On: 08/01/2013 11:26     EKG Interpretation   Date/Time:  Saturday August 01 2013 10:40:16 EDT Ventricular Rate:  115 PR Interval:  169 QRS Duration: 103 QT Interval:  350 QTC Calculation: 484 R Axis:   169 Text Interpretation:  Sinus tachycardia Low voltage with right axis  deviation Probable anteroseptal infarct, old Baseline wander in lead(s) II  III aVR aVL aVF V1 V2 V3 V4 V5 V6 Confirmed by Rubin Payor  MD, Treyveon Mochizuki  [phone]) on 08/01/2013 12:40:43 PM      MDM   Final diagnoses:  None    Patient presents with  shortness of breath and altered mental status. Had been hypoxic at home. Has had a cough for the last few days along with fevers. X-ray does not show pneumonia, however he is focal findings the left base of  my exam. He is a new renal failure, with newly elevated LFTs also. Lactic acid is 6. He at this point is his severe sepsis. Discussed with critical care, who recommended 3 L of IV fluids and reevaluation.   Patient is seen in the ED by critical care. He'll be admitted to the ICU. Blood pressures improved somewhat with fluids, however has renal failure and his LFTs are elevated. His second lactate is improved.  CRITICAL CARE Performed by: Mackie Pai Total critical care time:30 Critical care time was exclusive of separately billable procedures and treating other patients. Critical care was necessary to treat or prevent imminent or life-threatening deterioration. Critical care was time spent personally by me on the following activities: development of treatment plan with patient and/or surrogate as well as nursing, discussions with consultants, evaluation of patient's response to treatment, examination of patient, obtaining history from patient or surrogate, ordering and performing treatments and interventions, ordering and review of laboratory studies, ordering and review of radiographic studies, pulse oximetry and re-evaluation of patient's condition.   Jasper Riling. Alvino Chapel, MD 08/01/13 [phone]

## 2013-08-01 NOTE — ED Notes (Signed)
Pt undressed, in gown, on monitor, continuous pulse oximetry, blood pressure cuff and oxygen Numa (2L); family at bedside

## 2013-08-01 NOTE — Progress Notes (Signed)
ANTICOAGULATION CONSULT NOTE - Initial Consult  Pharmacy Consult for Heparin Indication: r/o PE  No Known Allergies  Patient Measurements: Height: 5\' 11"  (180.3 cm) Weight: 225 lb (102.059 kg) IBW/kg (Calculated) : 75.3 Heparin Dosing Weight: 96 kg  Vital Signs: Temp: 98.3 F (36.8 C) (03/14 1040) Temp src: Oral (03/14 1040) BP: 119/79 mmHg (03/14 1517) Pulse Rate: 100 (03/14 1517)  Labs:  Recent Labs  08/01/13 1051 08/01/13 1052 08/01/13 1105 08/01/13 1341 08/01/13 1522  HGB 17.9*  --  18.7*  --   --   HCT 52.3*  --  55.0*  --   --   PLT 189  --   --   --   --   APTT  --   --   --  42*  --   LABPROT  --   --   --  20.7*  --   INR  --   --   --  1.84*  --   CREATININE  --  3.03* 3.30*  --   --   TROPONINI  --   --   --   --  1.06*    Estimated Creatinine Clearance: 26.4 ml/min (by C-G formula based on Cr of 3.3).   Medical History: Past Medical History  Diagnosis Date  . Sleep apnea   . Hypertension   . High cholesterol   . Restless leg syndrome   . Degenerative arthritis   . Restless leg syndrome   . High cholesterol     Medications:  See electronic home med rec  Assessment: 68 y.o. male presents with SOB. Started on antibiotics for ?CAP and to start heparin for r/o PE due to recent cruise, obesity, finding of RLL atelectasis with hypoxemia that is out of proportion. CBC stable at baseline. INR 1.84 - no coumadin PTA. Pt with increased LFTs so suspect INR elevated due to liver dysfunction. Noted pt with renal dysfunction, est CrCl 26 ml/min.  Goal of Therapy:  Heparin level 0.3-0.7 units/ml Monitor platelets by anticoagulation protocol: Yes   Plan:  1) Heparin IV bolus 5000 units 2) Heparin gtt at 1500 units/hr 3) Will f/u 8 hr heparin level 4) Daily heparin level and CBC  Sherlon Handing, PharmD, BCPS Clinical pharmacist, pager [phone] 08/01/2013,4:24 PM

## 2013-08-01 NOTE — Procedures (Signed)
Central Venous Catheter Insertion Procedure Note TAVIEN CHESTNUT [id number] 14-Mar-1946  Procedure: Insertion of Central Venous Catheter Indications: Assessment of intravascular volume, Drug and/or fluid administration and Frequent blood sampling  Procedure Details Consent: Risks of procedure as well as the alternatives and risks of each were explained to the (patient/caregiver).  Consent for procedure obtained. Time Out: Verified patient identification, verified procedure, site/side was marked, verified correct patient position, special equipment/implants available, medications/allergies/relevent history reviewed, required imaging and test results available.  Performed  Maximum sterile technique was used including antiseptics, cap, gloves, gown, hand hygiene, mask and sheet. Skin prep: Chlorhexidine; local anesthetic administered A antimicrobial bonded/coated triple lumen catheter was placed in the right internal jugular vein using the Seldinger technique. Ultrasound guidance used.yes Catheter placed to 18 cm. Blood aspirated via all 3 ports and then flushed x 3. Line sutured x 2 and dressing applied.  Evaluation Blood flow good Complications: No apparent complications Patient did tolerate procedure well. Chest X-ray ordered to verify placement.  CXR: pending.  Richardson Landry Lynsay Fesperman ACNP Maryanna Shape PCCM Pager [phone] till 3 pm If no answer page [phone] 08/01/2013, 4:49 PM

## 2013-08-01 NOTE — Progress Notes (Signed)
ANTIBIOTIC CONSULT NOTE - INITIAL  Pharmacy Consult for Ceftriaxone Indication: Suspected CAP  No Known Allergies  Patient Measurements: Height: 5\' 11"  (180.3 cm) Weight: 225 lb (102.059 kg) IBW/kg (Calculated) : 75.3 Adjusted Body Weight:   Vital Signs: Temp: 98.3 F (36.8 C) (03/14 1040) Temp src: Oral (03/14 1040) BP: 91/58 mmHg (03/14 1330) Pulse Rate: 107 (03/14 1330) Intake/Output from previous day:   Intake/Output from this shift: Total I/O In: 3300 [I.V.:3300] Out: -   Labs:  Recent Labs  08/01/13 1051 08/01/13 1052 08/01/13 1105  WBC 10.7*  --   --   HGB 17.9*  --  18.7*  PLT 189  --   --   CREATININE  --  3.03* 3.30*   Estimated Creatinine Clearance: 26.4 ml/min (by C-G formula based on Cr of 3.3). No results found for this basename: VANCOTROUGH, VANCOPEAK, VANCORANDOM, GENTTROUGH, GENTPEAK, GENTRANDOM, TOBRATROUGH, TOBRAPEAK, TOBRARND, AMIKACINPEAK, AMIKACINTROU, AMIKACIN,  in the last 72 hours   Microbiology: No results found for this or any previous visit (from the past 720 hour(s)).  Medical History: Past Medical History  Diagnosis Date  . Sleep apnea   . Hypertension   . High cholesterol   . Restless leg syndrome   . Degenerative arthritis   . Restless leg syndrome   . High cholesterol     Medications:  See electronic med rec  Assessment: 67yom to start Ceftriaxone and Azithromycin for suspected CAP. Patient received doses of each ~1200 in ED. - CrCl 26 - WBC 10.7   Plan:  1. Ceftriaxone 1g IV daily - pharmacy will sign off from dosing as not adjustments are needed. 2. Continue Azithromycin 500mg  IV daily 3. Monitor renal function, cultures, clinical course   [place], Kant [phone] 08/01/2013,1:56 PM

## 2013-08-01 NOTE — Progress Notes (Signed)
eLink Physician-Brief Progress Note Patient Name: Justin Little DOB: 01-Jul-1945 MRN: [id number]  Date of Service  08/01/2013   HPI/Events of Note   Elevated troponin noted.  eICU Interventions   Cardiology consult.      Daron Breeding R. 08/01/2013, 5:58 PM

## 2013-08-01 NOTE — Consult Note (Addendum)
Cardiology Consultation Note  Patient ID: Justin Little, MRN: [id number], DOB/AGE: January 08, 1946 68 y.o. Admit date: 08/01/2013   Date of Consult: 08/01/2013 Primary Physician: Gennette Pac, MD Primary Cardiologist: None.  Chief Complaint: Lightheadedness Reason for Consult: Elevated troponin  HPI: 68 year old with history of hypertension, hyperlipidemia, and sleep apnea with CPAP, who presented to the ED after being found with decreased responsiveness by his wife and daughter this morning.  The patient returned from a cruise in the [place] 6 days ago, following which he developed URI symptoms, including rhinorrhea and a cough.  He therefore took a cough suppressant from his wife last night.  This morning, the patient felt very groggy and lightheaded and reportedly took vigorous stimulation to awaken.  The patient also endorses fevers with a maximal temperature of 102.2 F over the last 2 days.  He denies chest pain, shortness of breath, orthopnea, and PND.  His wife pointed out some bilateral ankle swelling during their cruise last week, but he otherwise denies leg pain and edema.  In the ED he had a single episode of blood-tinged sputum.  He otherwise has not noticed any significant bleeding.  He denies a personal or family history of bleeding or clotting disorders.  In the ED, the patient was found to be hypotensive with AKI and an elevated lactate of 6.21.  This has subsequently normalized with aggressive fluid rescucitation.  He has remained hypotensive requiring initiation of norepinephrine.  A troponin was also checked, which was elevated at 0.43.  This has climbed to 1.99.  Currently, the patient feels well.  Of note, he reports several "blacking out" episodes over the lat 9 months.  In total, he recalls 4 spells that resulted in feeling presyncopal (though patient states he could still hear people talking around him) since 11/2012.  He was evaluated by Dr. Tomi Likens of neurology without a  clear etiology found.  He is on Plavix for possible TIA's as the underlying etiology.  Past Medical History  Diagnosis Date  . Sleep apnea   . Hypertension   . High cholesterol   . Restless leg syndrome   . Degenerative arthritis   . Restless leg syndrome   . High cholesterol      Most Recent Cardiac Studies: None.   Surgical History:  Past Surgical History  Procedure Laterality Date  . Shoulder arthroscopy    . Tonsillectomy and adenoidectomy    . Colonoscopy       Home Meds: Prior to Admission medications   Medication Sig Start Date Mouhamad Teed Date Taking? Authorizing Provider  amLODipine (NORVASC) 10 MG tablet Take 10 mg by mouth daily.   Yes Historical Provider, MD  benazepril-hydrochlorthiazide (LOTENSIN HCT) 20-12.5 MG per tablet Take 2 tablets by mouth daily.   Yes Historical Provider, MD  clopidogrel (PLAVIX) 75 MG tablet Take 75 mg by mouth daily with breakfast.   Yes Historical Provider, MD  potassium chloride (K-DUR) 10 MEQ tablet Take 10 mEq by mouth daily.   Yes Historical Provider, MD  pravastatin (PRAVACHOL) 40 MG tablet Take 40 mg by mouth daily.   Yes Historical Provider, MD  rOPINIRole (REQUIP) 4 MG tablet Take 4 mg by mouth at bedtime.   Yes Historical Provider, MD    Inpatient Medications:  . [START ON 08/02/2013] azithromycin  500 mg Intravenous Q24H  . [START ON 08/02/2013] cefTRIAXone (ROCEPHIN)  IV  2 g Intravenous Q24H  . pantoprazole (PROTONIX) IV  40 mg Intravenous QHS   . sodium chloride 150  mL/hr at 08/01/13 1529  . DOBUTamine    . heparin 1,500 Units/hr (08/01/13 1909)  . norepinephrine (LEVOPHED) Adult infusion 6 mcg/min (08/01/13 October 19, 1945)  . phenylephrine (NEO-SYNEPHRINE) Adult infusion    . vasopressin (PITRESSIN) infusion - *FOR SHOCK*      Allergies: No Known Allergies  History   Social History  . Marital Status: Married    Spouse Name: [name]    Number of Children: 2  . Years of Education: college   Occupational History  .      Sales    Social History Main Topics  . Smoking status: Former Smoker    Types: Cigarettes  . Smokeless tobacco: Never Used     Comment: Quit 1985  . Alcohol Use: 12.6 oz/week    21 Shots of liquor per week     Comment: daily scotch-6 0z/day per patient.  . Drug Use: No  . Sexual Activity: Not on file   Other Topics Concern  . Not on file   Social History Narrative   Patient lives at home with his wife [name]). Patient is still working works with [employer].    Right handed.   Caffeine - None     Family History  Problem Relation Age of Onset  . Heart Problems Father   . High blood pressure Mother   . Cancer Mother      Review of Systems: A 12-system review of systems was performed and was negative except as noted in the HPI.  Labs:  Recent Labs  08/01/13 1522 08/01/13 1541  TROPONINI 1.06* 1.99*   Lab Results  Component Value Date   WBC 10.7* 08/01/2013   HGB 18.7* 08/01/2013   HCT 55.0* 08/01/2013   MCV 98.3 08/01/2013   PLT 189 08/01/2013    Recent Labs Lab 08/01/13 1052 08/01/13 1105  NA 146 144  K 4.3 4.0  CL 98 99  CO2 25  --   BUN 24* 26*  CREATININE 3.03* 3.30*  CALCIUM 8.9  --   PROT 7.2  --   BILITOT 0.5  --   ALKPHOS 59  --   ALT 2029*  --   AST 1980*  --   GLUCOSE 164* 159*   No results found for this basename: CHOL, HDL, LDLCALC, TRIG   No results found for this basename: DDIMER    Radiology/Studies:  Dg Chest Port 1 View  08/01/2013   CLINICAL DATA:  Central line placement  EXAM: PORTABLE CHEST - 1 VIEW  COMPARISON:  Chest radiograph 08/01/2013  FINDINGS: Heart can't mediastinal contours are stable. There is persistent atelectasis in the right mid lung. Right IJ central venous catheter tip terminates over the proximal superior vena cava. Consider advancing approximately 5 cm. Negative for pneumothorax or visible pleural effusion. The left lung is clear.  IMPRESSION: 1. Right IJ central venous catheter tip projects over the proximal superior vena  cava. Consider advancing approximately 5 cm. 2. Persistent right midlung atelectasis.   Electronically Signed   By: Curlene Dolphin M.D.   On: 08/01/2013 17:46   Dg Chest Port 1 View  08/01/2013   CLINICAL DATA:  Shortness of breath  EXAM: PORTABLE CHEST - 1 VIEW  COMPARISON:  None  FINDINGS: Cardiac shadow is within normal limits. The lungs are well aerated. Minimal platelike atelectasis is noted in the right mid lung. No focal confluent infiltrate is seen.  IMPRESSION: Right lung atelectasis.   Electronically Signed   By: Inez Catalina M.D.   On: 08/01/2013  11:26   EKG: Poor quality tracing with sinus tachycardia, right axis deviation, poor R-wave progression, and suggestion of right heart strain pattern.  Physical Exam: Blood pressure 120/63, pulse 99, temperature 97.8 F (36.6 C), temperature source Oral, resp. rate 28, height 5\' 11"  (1.803 m), weight 107.8 kg (237 lb 10.5 oz), SpO2 95.00%. General: Well developed, well nourished, in no acute distress. Head: Normocephalic, atraumatic, sclera non-icteric, no xanthomas, nares are without discharge.  Neck: Negative for carotid bruits. JVD not elevated. Lungs: Diminished breath sounds at bases.  Lungs otherwise clear with normal work of breathing. Heart: Tachycardic but regular with S1 S2. No murmurs, rubs, or gallops appreciated. Abdomen: Soft, non-tender, non-distended with normoactive bowel sounds. No hepatomegaly. No rebound/guarding. No obvious abdominal masses. Msk:  Strength and tone appear normal for age. Extremities: No clubbing or cyanosis. Trace pretibial edema bilaterally.  Distal pedal pulses are 2+ and equal bilaterally. Neuro: Alert and oriented X 3. No facial asymmetry. No focal deficit. Moves all extremities spontaneously. Psych:  Responds to questions appropriately with a normal affect.    Assessment and Plan: 67-y/o M with history of hypertension, hyperlipidemia, and OSA, whom we have been asked to see after an episode of  altered consciousness with subsequent findings of hypotension, hypoperfusion (AKI, elevated lactate), and troponin elevation.  Most concerning etiology would be pulmonary embolism, though patient overall is low risk with the exception of recent travel.  However, his hypotension, hypoxia, and EKG findings are suspicious for this.  Septic shock is another consideration in light of his recent URI symptoms and reported fever, though his relatively minimal leukocytosis is notable.  The patient's symptoms and EKG argue against an acute plaque rupture MI; I suspect that his troponin elevation is more likely the result of supply-demand mismatch and cardiac strain from his acute illness.  Troponin elevation: - Continue heparin infusion and give ASA 324 mg x 1 followed by 81 mg daily - Okay to hold Plavix for now - Ideally would give atorvastatin, but will hold in the setting of transaminitis - Agree with echocardiogram - Continue to evaluate for and treat other causes of hypotension/hypoxia (i.e. PE and septic shock). - If workup for PE is negative, patient may ultimately require ischemia evaluation.  No indication for urgent LHC, particularly in light of his AKI. - Consider ordering V/Q scan to evaluate for PE.  If present, thrombolytics could be considered as this would qualify as a massive PE. - Will risk stratify with fasting lipid panel and hemoglobin A1c  Hypertension: - Hold home antihypertensive medications in the setting of shock.  OSA: - CPAP per primary team.  Signed, Brooklin Rieger A. MD 08/01/2013, 8:10 PM  Addendum (08/01/13 @ 2227): Limited bedside echocardiogram was performed.  Windows are suboptimal but LV function appears grossly normal.  RV is at least mildly to moderately dilated with normal contraction but septal motion suggestive of pressure/volume overload.  IVC is also dilated with decreased respiratory variation suggestive of increased CVP/RAP.  Findings are suspicious for PE.   Recommend formal echo tomorrow.  Findings were discussed with [facility]-Link physician on call.

## 2013-08-01 NOTE — ED Notes (Signed)
Per [facility], pt difficult to arouse by wife. Found wheezing in all fields and nail beds blue. sats 75 by fire, placed on a NRB and sats up to 90%. Pt was belly breathing. HR 120, Hypotensive at 90/87, other pressures 98/67, 89/60, 20g to RAC and 1 liter hung, pt was diaphoretic. Given one 5mg  albuterol treatment. Denies any pain.

## 2013-08-01 NOTE — ED Notes (Signed)
Bladder scanner completed and 160 ml shown.

## 2013-08-01 NOTE — ED Notes (Signed)
Repeat lactic acid 1.15 upgrade to Level I sepsis protocol.

## 2013-08-02 ENCOUNTER — Inpatient Hospital Stay (HOSPITAL_COMMUNITY): Payer: Medicare Other

## 2013-08-02 DIAGNOSIS — I517 Cardiomegaly: Secondary | ICD-10-CM

## 2013-08-02 DIAGNOSIS — J189 Pneumonia, unspecified organism: Secondary | ICD-10-CM

## 2013-08-02 DIAGNOSIS — R799 Abnormal finding of blood chemistry, unspecified: Secondary | ICD-10-CM

## 2013-08-02 DIAGNOSIS — I214 Non-ST elevation (NSTEMI) myocardial infarction: Secondary | ICD-10-CM | POA: Diagnosis present

## 2013-08-02 DIAGNOSIS — R778 Other specified abnormalities of plasma proteins: Secondary | ICD-10-CM | POA: Diagnosis present

## 2013-08-02 DIAGNOSIS — R7989 Other specified abnormal findings of blood chemistry: Secondary | ICD-10-CM

## 2013-08-02 LAB — CBC
HEMATOCRIT: 42.6 % (ref 39.0–52.0)
Hemoglobin: 14.4 g/dL (ref 13.0–17.0)
MCH: 32.9 pg (ref 26.0–34.0)
MCHC: 33.8 g/dL (ref 30.0–36.0)
MCV: 97.3 fL (ref 78.0–100.0)
Platelets: 151 10*3/uL (ref 150–400)
RBC: 4.38 MIL/uL (ref 4.22–5.81)
RDW: 13.5 % (ref 11.5–15.5)
WBC: 8.3 10*3/uL (ref 4.0–10.5)

## 2013-08-02 LAB — TROPONIN I
TROPONIN I: 1.97 ng/mL — AB (ref <0.30)
Troponin I: 2.7 ng/mL (ref <0.30)

## 2013-08-02 LAB — HEPARIN LEVEL (UNFRACTIONATED)
HEPARIN UNFRACTIONATED: 0.31 [IU]/mL (ref 0.30–0.70)
HEPARIN UNFRACTIONATED: 0.29 [IU]/mL — AB (ref 0.30–0.70)

## 2013-08-02 LAB — LIPID PANEL
Cholesterol: 98 mg/dL (ref 0–200)
HDL: 37 mg/dL — ABNORMAL LOW (ref >39)
LDL CALC: 45 mg/dL (ref 0–99)
TRIGLYCERIDES: 81 mg/dL (ref <150)
Total CHOL/HDL Ratio: 2.6 RATIO
VLDL: 16 mg/dL (ref 0–40)

## 2013-08-02 LAB — BASIC METABOLIC PANEL
BUN: 23 mg/dL (ref 6–23)
CO2: 25 mEq/L (ref 19–32)
CREATININE: 1.59 mg/dL — AB (ref 0.50–1.35)
Calcium: 7 mg/dL — ABNORMAL LOW (ref 8.4–10.5)
Chloride: 105 mEq/L (ref 96–112)
GFR calc non Af Amer: 43 mL/min — ABNORMAL LOW (ref >90)
GFR, EST AFRICAN AMERICAN: 50 mL/min — AB (ref >90)
Glucose, Bld: 116 mg/dL — ABNORMAL HIGH (ref 70–99)
POTASSIUM: 3.9 meq/L (ref 3.7–5.3)
Sodium: 142 mEq/L (ref 137–147)

## 2013-08-02 LAB — BLOOD GAS, ARTERIAL
ACID-BASE EXCESS: 0.1 mmol/L (ref 0.0–2.0)
Bicarbonate: 26 mEq/L — ABNORMAL HIGH (ref 20.0–24.0)
DRAWN BY: 34779
O2 CONTENT: 4 L/min
O2 Saturation: 95.3 %
PH ART: 7.294 — AB (ref 7.350–7.450)
Patient temperature: 97.8
TCO2: 27.7 mmol/L (ref 0–100)
pCO2 arterial: 54.8 mmHg — ABNORMAL HIGH (ref 35.0–45.0)
pO2, Arterial: 77.5 mmHg — ABNORMAL LOW (ref 80.0–100.0)

## 2013-08-02 LAB — HEPATIC FUNCTION PANEL
ALK PHOS: 46 U/L (ref 39–117)
ALT: 1804 U/L — ABNORMAL HIGH (ref 0–53)
AST: 2515 U/L — ABNORMAL HIGH (ref 0–37)
Albumin: 2.9 g/dL — ABNORMAL LOW (ref 3.5–5.2)
TOTAL PROTEIN: 5.7 g/dL — AB (ref 6.0–8.3)
Total Bilirubin: 0.4 mg/dL (ref 0.3–1.2)

## 2013-08-02 LAB — LEGIONELLA ANTIGEN, URINE: Legionella Antigen, Urine: NEGATIVE

## 2013-08-02 LAB — HEMOGLOBIN A1C
HEMOGLOBIN A1C: 6 % — AB (ref <5.7)
Mean Plasma Glucose: 126 mg/dL — ABNORMAL HIGH (ref <117)

## 2013-08-02 LAB — LACTIC ACID, PLASMA: Lactic Acid, Venous: 1 mmol/L (ref 0.5–2.2)

## 2013-08-02 LAB — MAGNESIUM: Magnesium: 1.9 mg/dL (ref 1.5–2.5)

## 2013-08-02 MED ORDER — ENOXAPARIN SODIUM 40 MG/0.4ML ~~LOC~~ SOLN: 40.0000 mg | SUBCUTANEOUS | Status: DC

## 2013-08-02 MED ORDER — METOPROLOL TARTRATE 25 MG PO TABS
25.0000 mg | ORAL_TABLET | Freq: Two times a day (BID) | ORAL | Status: DC
  Administered 2013-08-02 – 2013-08-03 (×3): 25 mg via ORAL
  Filled 2013-08-02 (×4): qty 1

## 2013-08-02 MED ORDER — CLOPIDOGREL BISULFATE 75 MG PO TABS
75.0000 mg | ORAL_TABLET | Freq: Every day | ORAL | Status: DC
  Administered 2013-08-02 – 2013-08-03 (×2): 75 mg via ORAL
  Filled 2013-08-02 (×3): qty 1

## 2013-08-02 MED ORDER — TECHNETIUM TO 99M ALBUMIN AGGREGATED
6.0000 | Freq: Once | INTRAVENOUS | Status: AC | PRN
  Administered 2013-08-02: 6 via INTRAVENOUS

## 2013-08-02 MED ORDER — TECHNETIUM TC 99M DIETHYLENETRIAME-PENTAACETIC ACID
40.0000 | Freq: Once | INTRAVENOUS | Status: AC | PRN
  Administered 2013-08-02: 40 via INTRAVENOUS

## 2013-08-02 MED ORDER — ENOXAPARIN SODIUM 40 MG/0.4ML ~~LOC~~ SOLN
40.0000 mg | Freq: Every day | SUBCUTANEOUS | Status: DC
  Administered 2013-08-02 – 2013-08-03 (×2): 40 mg via SUBCUTANEOUS
  Filled 2013-08-02 (×3): qty 0.4

## 2013-08-02 NOTE — Progress Notes (Addendum)
SUBJECTIVE:  Feeling better  OBJECTIVE:   Vitals:   Filed Vitals:   08/02/13 0718 08/02/13 0800 08/02/13 0900 08/02/13 1000  BP:  142/125 152/91 139/76  Pulse:  78 99 85  Temp: 98 F (36.7 C)     TempSrc: Oral     Resp:  20 25 22   Height:      Weight:      SpO2:  94% 96% 93%   I&O's:   Intake/Output Summary (Last 24 hours) at 08/02/13 1123 Last data filed at 08/02/13 1000  Gross per 24 hour  Intake 9490.12 ml  Output   1956 ml  Net 7534.12 ml   TELEMETRY: Reviewed telemetry pt in NSR:     PHYSICAL EXAM General: Well developed, well nourished, in no acute distress Head: Eyes PERRLA, No xanthomas.   Normal cephalic and atramatic  Lungs:   Crackles anteriorly Heart:   HRRR S1 S2 Pulses are 2+ & equal. Abdomen: Bowel sounds are positive, abdomen soft and non-tender without masses Neuro: Alert and oriented X 3. Psych:  Good affect, responds appropriately   LABS: Basic Metabolic Panel:  Recent Labs  08/01/13 1052 08/01/13 1105 08/01/13 1341 08/02/13 0100  NA 146 144  --  142  K 4.3 4.0  --  3.9  CL 98 99  --  105  CO2 25  --   --  25  GLUCOSE 164* 159*  --  116*  BUN 24* 26*  --  23  CREATININE 3.03* 3.30*  --  1.59*  CALCIUM 8.9  --   --  7.0*  MG  --   --  1.1* 1.9  PHOS  --   --  3.4  --    Liver Function Tests:  Recent Labs  08/01/13 1052 08/02/13 0100  AST 1980* 2515*  ALT 2029* 1804*  ALKPHOS 59 46  BILITOT 0.5 0.4  PROT 7.2 5.7*  ALBUMIN 3.6 2.9*    Recent Labs  08/01/13 1325  LIPASE 17   CBC:  Recent Labs  08/01/13 1051 08/01/13 1105 08/02/13 0100  WBC 10.7*  --  8.3  HGB 17.9* 18.7* 14.4  HCT 52.3* 55.0* 42.6  MCV 98.3  --  97.3  PLT 189  --  151   Cardiac Enzymes:  Recent Labs  08/01/13 2141 08/02/13 0100 08/02/13 0741  TROPONINI 2.76* 2.70* 1.97*   BNP: No components found with this basename: POCBNP,  D-Dimer: No results found for this basename: DDIMER,  in the last 72 hours Hemoglobin A1C: No results  found for this basename: HGBA1C,  in the last 72 hours Fasting Lipid Panel:  Recent Labs  08/02/13 0100  CHOL 98  HDL 37*  LDLCALC 45  TRIG 81  CHOLHDL 2.6   Thyroid Function Tests: No results found for this basename: TSH, T4TOTAL, FREET3, T3FREE, THYROIDAB,  in the last 72 hours Anemia Panel: No results found for this basename: VITAMINB12, FOLATE, FERRITIN, TIBC, IRON, RETICCTPCT,  in the last 72 hours Coag Panel:   Lab Results  Component Value Date   INR 1.30 08/01/2013   INR 1.84* 08/01/2013    RADIOLOGY: Dg Chest Port 1 View  08/01/2013   CLINICAL DATA:  Central line placement  EXAM: PORTABLE CHEST - 1 VIEW  COMPARISON:  Chest radiograph 08/01/2013  FINDINGS: Heart can't mediastinal contours are stable. There is persistent atelectasis in the right mid lung. Right IJ central venous catheter tip terminates over the proximal superior vena cava. Consider advancing approximately 5 cm. Negative  for pneumothorax or visible pleural effusion. The left lung is clear.  IMPRESSION: 1. Right IJ central venous catheter tip projects over the proximal superior vena cava. Consider advancing approximately 5 cm. 2. Persistent right midlung atelectasis.   Electronically Signed   By: Curlene Dolphin M.D.   On: 08/01/2013 17:46   Dg Chest Port 1 View  08/01/2013   CLINICAL DATA:  Shortness of breath  EXAM: PORTABLE CHEST - 1 VIEW  COMPARISON:  None  FINDINGS: Cardiac shadow is within normal limits. The lungs are well aerated. Minimal platelike atelectasis is noted in the right mid lung. No focal confluent infiltrate is seen.  IMPRESSION: Right lung atelectasis.   Electronically Signed   By: Inez Catalina M.D.   On: 08/01/2013 11:26   Assessment and Plan:  67-y/o M with history of hypertension, hyperlipidemia, and OSA, whom we have been asked to see after an episode of altered consciousness with subsequent findings of hypotension, hypoperfusion (AKI, elevated lactate), and troponin elevation. Most concerning  etiology would be pulmonary embolism, though patient overall is low risk with the exception of recent travel. However, his hypotension, hypoxia, and EKG findings are suspicious for this. Septic shock is another consideration in light of his recent URI symptoms and reported fever, though his relatively minimal leukocytosis is notable. The patient's symptoms and EKG argue against an acute plaque rupture MI; Suspect that his troponin elevation is more likely the result of supply-demand mismatch and cardiac strain from his acute illness. Bedside limited echo last PM showed normal LVF and mild to moderate RV dilatation suggestive of RV pressure/volume overload and dilated IVC worrisome for acute PE.   Troponin elevation - enzymes have peaked and are trending downward - Continue IV Heparin until PE ruled out - continue Plavix/ beta blocker/ASA - he is on Plavix for history of TIA - Ideally would give atorvastatin, but will hold in the setting of transaminitis  - Check 2D echocardiogram  - Continue to evaluate for and treat other causes of hypotension/hypoxia (i.e. PE and septic shock).  - If workup for PE is negative, patient may ultimately require ischemia evaluation. No indication for urgent LHC, particularly in light of his AKI.  -  V/Q scan to evaluate for PE - discussed with Dr. Jamal Collin - bedside echo with dilated RV and dilated IVC worrisome for right sided pressure overload - Will risk stratify with fasting lipid panel and hemoglobin A1c  Hypertension: Restart home BP meds as BP allows         Justin Margarita, MD  08/02/2013  11:23 AM

## 2013-08-02 NOTE — Progress Notes (Signed)
PULMONARY / CRITICAL CARE MEDICINE   Name: COLLIS THEDE MRN: [id number] DOB: [birth year]    ADMISSION DATE:  08/01/2013   REFERRING MD :  EDP PRIMARY SERVICE: PCCM  BRIEF PATIENT DESCRIPTION: 66M admitted via ED with 5 days of viral-type prodrome, chest congestion and increased malaise and drowsiness on morning of admission. One episode of scant hemoptysis in ED. Admitted dx of severe sepsis, likely PNA, R/O PE.   SIGNIFICANT EVENTS / STUDIES:  3/14 mildly elevated TpI. Cards consult 3/14 Echo: LVEF 55-60%. No RWMAs. Normal RV and RVSP estimate. Mildly dilated RA 3/15 LE venous Dopplers:  3/15 V/Q scan: No significant perfusion defects    LINES / TUBES: R IJ CVL 3/14 >>    CULTURES: Strep Ag 3/14 >> NEG Legionella Ag 3/14 >> NEG RVP 3/14 >>  Resp 3/14 >> Urine 3/14 >> UA negative Blood 3/14 >> 1/2 GPC clusters >>    ANTIBIOTICS: Azithro 3/14 >> Ceftriaxone 3/14 >>   SUBJECTIVE:  Much improved. No new complaints. No distress. Purulent sputum without further hemoptysis   VITAL SIGNS: Temp:  [97.8 F (36.6 C)-98.7 F (37.1 C)] 98.7 F (37.1 C) (03/15 1247) Pulse Rate:  [61-106] 99 (03/15 1411) Resp:  [14-28] 19 (03/15 1300) BP: (98-153)/(53-125) 151/85 mmHg (03/15 1411) SpO2:  [92 %-99 %] 97 % (03/15 1300) FiO2 (%):  [35 %] 35 % (03/14 1450) Weight:  [107.6 kg (237 lb 3.4 oz)-107.8 kg (237 lb 10.5 oz)] 107.6 kg (237 lb 3.4 oz) (03/15 0600) HEMODYNAMICS: CVP:  [15 mmHg-21 mmHg] 15 mmHg VENTILATOR SETTINGS: Vent Mode:  [-]  FiO2 (%):  [35 %] 35 % INTAKE / OUTPUT: Intake/Output     03/14 0701 - 03/15 0700 03/15 0701 - 03/16 0700   P.O. 720 360   I.V. (mL/kg) 8102.1 (75.3) 748.5 (7)   IV Piggyback 50 300   Total Intake(mL/kg) 8872.1 (82.5) 1408.5 (13.1)   Urine (mL/kg/hr) 1330 975 (1.2)   Stool  1 (0)   Total Output 1330 976   Net +7542.1 +432.5        Urine Occurrence  1 x     PHYSICAL EXAMINATION: General: NAD Neuro: no focal  deficits HEENT:  WNL Cardiovascular: RRR s M Lungs: scattered B rhonchi Abdomen: Soft nt +bs Ext: warm, no edema  LABS: I have reviewed all of today's lab results. Relevant abnormalities are discussed in the A/P section  CXR:  Minimal RLL AS dz  ASSESSMENT / PLAN:  PULMONARY A:  Mild acute resp failure - suspect CAP Doubt PE P:   Cont supplemental O2 DC UFH gtt  CARDIOVASCULAR A: Septic shock, resolved Mild elevation trop I - likely type 2 NSTEMI P:  Cont cardiac monitoring Add metoprolol Cont clopidogrel and ASA Holding statin due to elevated LFTs  Cards following  RENAL A: Acute renal failure, nonoliguric - improving Lactic acidosis, resolved P:   Monitor BMET intermittently Monitor I/Os Correct electrolytes as indicated  GASTROINTESTINAL A:  Elevated LFTs P:   SUP N/I Cont PO diet Monitor LFTs intermittently - eval further if nonresolving  HEMATOLOGIC A: Hemoconcentration, resolved P:  DVT px: enoxaparin Monitor cbc intermittently  INFECTIOUS A:  Presumed CAP 1/2 positive blood culture - suspect contaminant P:   Micro and abx as above Since clinically much improved, will not add vanc empirically  NEUROLOGIC A:  Hx of TIA Hx of restless leg syndrome P:   Cont current Rx  GLOBAL Pt, wife and daughter updated in detail :   Shanon Brow  Alva Garnet, MD ; [facility] [phone].  After 5:30 PM or weekends, call [phone]

## 2013-08-02 NOTE — Progress Notes (Signed)
UR completed 

## 2013-08-02 NOTE — Progress Notes (Signed)
CRITICAL VALUE ALERT  Critical value received: + blood culture  Date of notification:  08/02/13  Time of notification:  1259  Critical value read back:yes  Nurse who received alert:  Rodney Cruise   MD notified (1st page):  Dr. Alva Garnet  Time of first page:  1315  MD notified (2nd page):  Time of second page:  Responding MD:  Dr. Alva Garnet  Time MD responded:  1318

## 2013-08-02 NOTE — Plan of Care (Signed)
Problem: Phase II Progression Outcomes Goal: Review culture results with MD if needed Outcome: Progressing Gm + cocci in clusters was reported by lab.(NOT CHAINS).

## 2013-08-02 NOTE — Progress Notes (Signed)
Transferred to 3W04 after report called to Estanislado Pandy, RN. On PNA pathway. Transported via wheelchair with O2 and monitor. Tolerated well. Update given to RN on arrival.

## 2013-08-02 NOTE — Progress Notes (Signed)
ANTICOAGULATION CONSULT NOTE - Follow Up Consult  Pharmacy Consult for heparin Indication: r/o PE  Labs:  Recent Labs  08/01/13 1051 08/01/13 1052 08/01/13 1105 08/01/13 1341 08/01/13 1522 08/01/13 1540 08/01/13 1541 08/01/13 2141 08/02/13 0100  HGB 17.9*  --  18.7*  --   --   --   --   --  14.4  HCT 52.3*  --  55.0*  --   --   --   --   --  42.6  PLT 189  --   --   --   --   --   --   --  151  APTT  --   --   --  42*  --   --   --   --   --   LABPROT  --   --   --  20.7*  --  15.9*  --   --   --   INR  --   --   --  1.84*  --  1.30  --   --   --   HEPARINUNFRC  --   --   --   --   --   --   --   --  0.31  CREATININE  --  3.03* 3.30*  --   --   --   --   --   --   TROPONINI  --   --   --   --  1.06*  --  1.99* 2.76*  --     Assessment: 68yo male therapeutic on heparin with initial dosing for possible PE though at low end of goal and lab drawn 6hr after large bolus, expect level to trend down at current rate.  Goal of Therapy:  Heparin level 0.3-0.7 units/ml   Plan:  Will increase heparin gtt slightly to 1600 units/hr and check level in [place], PharmD, BCPS  08/02/2013,1:39 AM

## 2013-08-02 NOTE — Progress Notes (Signed)
  Echocardiogram 2D Echocardiogram has been performed.  Justin Little 08/02/2013, 9:05 AM

## 2013-08-02 NOTE — Plan of Care (Signed)
Problem: Phase I Progression Outcomes Goal: OOB as tolerated unless otherwise ordered Outcome: Completed/Met Date Met:  08/02/13 Up to BR and BSC Goal: Consider pulmonary consult Outcome: Completed/Met Date Met:  08/02/13 Admitted by CCM Goal: Code status addressed with pt/family Outcome: Completed/Met Date Met:  08/02/13 Full code Goal: Initial discharge plan identified Outcome: Completed/Met Date Met:  08/02/13 Home with wife and daughter  Problem: Phase II Progression Outcomes Goal: Review culture results with MD if needed Outcome: Progressing 1 + BC for gm positive cocci  In chains and clusters.

## 2013-08-03 ENCOUNTER — Other Ambulatory Visit: Payer: Self-pay | Admitting: Cardiology

## 2013-08-03 DIAGNOSIS — I214 Non-ST elevation (NSTEMI) myocardial infarction: Secondary | ICD-10-CM

## 2013-08-03 DIAGNOSIS — E7800 Pure hypercholesterolemia, unspecified: Secondary | ICD-10-CM

## 2013-08-03 DIAGNOSIS — M7989 Other specified soft tissue disorders: Secondary | ICD-10-CM

## 2013-08-03 LAB — CBC
HCT: 43 % (ref 39.0–52.0)
Hemoglobin: 14.8 g/dL (ref 13.0–17.0)
MCH: 32.7 pg (ref 26.0–34.0)
MCHC: 34.4 g/dL (ref 30.0–36.0)
MCV: 94.9 fL (ref 78.0–100.0)
PLATELETS: 142 10*3/uL — AB (ref 150–400)
RBC: 4.53 MIL/uL (ref 4.22–5.81)
RDW: 12.8 % (ref 11.5–15.5)
WBC: 7.4 10*3/uL (ref 4.0–10.5)

## 2013-08-03 LAB — COMPREHENSIVE METABOLIC PANEL
ALBUMIN: 3 g/dL — AB (ref 3.5–5.2)
ALT: 1331 U/L — ABNORMAL HIGH (ref 0–53)
AST: 726 U/L — AB (ref 0–37)
Alkaline Phosphatase: 50 U/L (ref 39–117)
BUN: 10 mg/dL (ref 6–23)
CALCIUM: 8 mg/dL — AB (ref 8.4–10.5)
CO2: 31 mEq/L (ref 19–32)
CREATININE: 0.72 mg/dL (ref 0.50–1.35)
Chloride: 103 mEq/L (ref 96–112)
GFR calc Af Amer: >90 mL/min (ref >90)
GFR calc non Af Amer: >90 mL/min (ref >90)
Glucose, Bld: 112 mg/dL — ABNORMAL HIGH (ref 70–99)
Potassium: 3.7 mEq/L (ref 3.7–5.3)
Sodium: 144 mEq/L (ref 137–147)
TOTAL PROTEIN: 6.1 g/dL (ref 6.0–8.3)
Total Bilirubin: 0.7 mg/dL (ref 0.3–1.2)

## 2013-08-03 LAB — URINE CULTURE

## 2013-08-03 LAB — CULTURE, BLOOD (ROUTINE X 2)

## 2013-08-03 LAB — PROCALCITONIN: Procalcitonin: 0.76 ng/mL

## 2013-08-03 LAB — TROPONIN I: Troponin I: 1.22 ng/mL (ref <0.30)

## 2013-08-03 MED ORDER — LEVOFLOXACIN 750 MG PO TABS: 750.0000 mg | ORAL_TABLET | ORAL | Status: AC

## 2013-08-03 MED ORDER — HYDROCHLOROTHIAZIDE 12.5 MG PO CAPS: 12.5000 mg | ORAL_CAPSULE | Freq: Every day | ORAL | Status: DC

## 2013-08-03 MED ORDER — ASPIRIN 81 MG PO TBEC: 81.0000 mg | DELAYED_RELEASE_TABLET | Freq: Every day | ORAL | Status: AC

## 2013-08-03 MED ORDER — BENAZEPRIL HCL 20 MG PO TABS: 20.0000 mg | ORAL_TABLET | Freq: Every day | ORAL | Status: DC

## 2013-08-03 MED ORDER — BENAZEPRIL-HYDROCHLOROTHIAZIDE 20-12.5 MG PO TABS: 2.0000 | ORAL_TABLET | Freq: Every day | ORAL | Status: DC

## 2013-08-03 MED ORDER — METOPROLOL SUCCINATE ER 50 MG PO TB24: 50.0000 mg | ORAL_TABLET | Freq: Every day | ORAL | Status: DC

## 2013-08-03 MED ORDER — AMLODIPINE BESYLATE 10 MG PO TABS: 10.0000 mg | ORAL_TABLET | Freq: Every day | ORAL | Status: DC

## 2013-08-03 MED ORDER — LEVOFLOXACIN 750 MG PO TABS
750.0000 mg | ORAL_TABLET | ORAL | Status: DC
  Filled 2013-08-03 (×2): qty 1

## 2013-08-03 NOTE — Progress Notes (Signed)
VASCULAR LAB PRELIMINARY  PRELIMINARY  PRELIMINARY  PRELIMINARY  Bilateral lower extremity venous duplex  completed.    Preliminary report:  Bilateral:  No evidence of DVT, superficial thrombosis, or Baker's Cyst.    Shereen Marton, RVT 08/03/2013, 8:51 AM

## 2013-08-03 NOTE — Progress Notes (Signed)
Patient Profile: 68 y/o WM with history of hypertension, hyperlipidemia, and OSA, whom we have been asked to see after an episode of altered consciousness with subsequent findings of hypotension, hypoxia, hypoperfusion (AKI, elevated lactate), and troponin elevation (peak of 2.76). The patient's symptoms and EKG on arrival argued against an acute plaque rupture MI; It was initially suspected that his troponin elevation was more likely the result of supply-demand mismatch and cardiac strain from his acute illness. Bedside limited echo showed normal LVF and mild to moderate RV dilatation suggestive of RV pressure/volume overload and dilated IVC worrisome for acute PE. He underwent a Pulmonary Perfusion and Ventilation study, which showed no evidence of pulmonary embolism. Septic shock is another consideration in light of his recent URI symptoms and reported fever. Critical Care has been  serving as primary .   Pt has denied chest pain. He works out at [employer] 3 days a week, both cardio and weight training, and plays golf frequently. He denies any exertional chest pressure, tightness or heaviness with these activities.   Cardiac risk factors include male sex, age, HTN and HLD (both treated), prediabetes (Hgb A1C of 6.0) and strong family h/o of early CAD (father had multiple MIs, the first at age 68).   Subjective: Feels much better today. Denies CP/SOB.   Objective: Vital signs in last 24 hours: Temp:  [97.7 F (36.5 C)-98.9 F (37.2 C)] 97.7 F (36.5 C) (03/16 0551) Pulse Rate:  [83-99] 83 (03/16 0551) Resp:  [18-22] 18 (03/16 0551) BP: (136-154)/(76-91) 153/90 mmHg (03/16 0551) SpO2:  [93 %-99 %] 96 % (03/16 0551) Last BM Date: 08/02/13  Intake/Output from previous day: 03/15 0701 - 03/16 0700 In: 1548.5 [P.O.:480; I.V.:768.5; IV Piggyback:300] Out: 2076 [Urine:2075; Stool:1] Intake/Output this shift:    Medications Current Facility-Administered Medications  Medication Dose Route  Frequency Provider Last Rate Last Dose  . 0.9 %  sodium chloride infusion   Intravenous Continuous Wilhelmina Mcardle, MD 20 mL/hr at 08/02/13 1223    . aspirin EC tablet 81 mg  81 mg Oral Daily Glena Norfolk End, MD   81 mg at 08/02/13 1009  . azithromycin (ZITHROMAX) 500 mg in dextrose 5 % 250 mL IVPB  500 mg Intravenous Q24H Grace Bushy Minor, NP   500 mg at 08/02/13 1227  . cefTRIAXone (ROCEPHIN) 2 g in dextrose 5 % 50 mL IVPB  2 g Intravenous Q24H Brand Males, MD   2 g at 08/02/13 1009  . clopidogrel (PLAVIX) tablet 75 mg  75 mg Oral Q breakfast Wilhelmina Mcardle, MD   75 mg at 08/03/13 0109  . enoxaparin (LOVENOX) injection 40 mg  40 mg Subcutaneous Daily Wilhelmina Mcardle, MD   40 mg at 08/02/13 1650  . metoprolol tartrate (LOPRESSOR) tablet 25 mg  25 mg Oral BID Wilhelmina Mcardle, MD   25 mg at 08/02/13 2019  . rOPINIRole (REQUIP) tablet 4 mg  4 mg Oral QHS Mariea Clonts, MD   4 mg at 08/02/13 2019  . sodium chloride 0.9 % injection 10-40 mL  10-40 mL Intracatheter Q12H Brand Males, MD   40 mL at 08/02/13 1020  . sodium chloride 0.9 % injection 10-40 mL  10-40 mL Intracatheter PRN Brand Males, MD        PE: General appearance: alert, cooperative and no distress Lungs: clear to auscultation bilaterally Heart: regular rate and rhythm, S1, S2 normal, no murmur, click, rub or gallop Extremities: no LEE Pulses: 2+ and symmetric  Skin: warm and dry Neurologic: Grossly normal  Lab Results:   Recent Labs  08/01/13 1051 08/01/13 1105 08/02/13 0100 08/03/13 0426  WBC 10.7*  --  8.3 7.4  HGB 17.9* 18.7* 14.4 14.8  HCT 52.3* 55.0* 42.6 43.0  PLT 189  --  151 142*   BMET  Recent Labs  08/01/13 1052 08/01/13 1105 08/02/13 0100 08/03/13 0426  NA 146 144 142 144  K 4.3 4.0 3.9 3.7  CL 98 99 105 103  CO2 25  --  25 31  GLUCOSE 164* 159* 116* 112*  BUN 24* 26* 23 10  CREATININE 3.03* 3.30* 1.59* 0.72  CALCIUM 8.9  --  7.0* 8.0*   PT/INR  Recent Labs  08/01/13 1341  08/01/13 1540  LABPROT 20.7* 15.9*  INR 1.84* 1.30   Cholesterol  Recent Labs  08/02/13 0100  CHOL 98    Cardiac Panel (last 3 results)  Recent Labs  08/02/13 0100 08/02/13 0741 08/03/13 0426  TROPONINI 2.70* 1.97* 1.22*    Studies/Results:  2D echo 08/02/13 Study Conclusions  - Left ventricle: The cavity size was normal. Wall thickness was increased in a pattern of mild LVH. Systolic function was normal. The estimated ejection fraction was in the range of 55% to 60%. Wall motion was normal; there were no regional wall motion abnormalities. Left ventricular diastolic function parameters were normal. - Right atrium: The atrium was mildly dilated. - Atrial septum: No defect or patent foramen ovale was identified.   Pulmonary Perfusion and Ventilation Scan 08/02/13 FINDINGS: Ventilation: No focal defect. Heterogeneous tracer uptake.  Perfusion: No wedge shaped peripheral perfusion defects to suggest acute pulmonary embolism.  IMPRESSION: No evidence of pulmonary embolism.   Assessment/Plan  Active Problems:   Pneumonia   Acute renal failure   Septic shock   Elevated troponin   NSTEMI (non-ST elevated myocardial infarction)  Plan: 68-y/o M with history of hypertension, hyperlipidemia, and OSA, whom we have been asked to see after an episode of altered consciousness with subsequent findings of hypotension, hypoxia, hypoperfusion (AKI, elevated lactate), and troponin elevation (peak of 2.76). Echo findings of mild-moderate dilated RA and a dilated IVC were worrisome for PE. Subsequent VQ scan ultimately ruled out PE. With rule out of PE, septic shock is felt mostly likely the etiology of his complaints.    1. NSTEMI:  felt most likely secondary to Type 2 demand ischemia, in the setting of septic shock. He continues to deny chest pain, pressure and tightness.  He works out at [employer] 3 days a week, both cardio and weight training, and plays golf frequently. He  denies any exertional chest discomfort with any of these activities. His EKG shows no signs of ischemia (no ST/ Twave abnormalities). His 2D echo also demonstrated normal systolic function with an EF of 55-60%, w/o WMA. Based on his lack of symptoms, unimpressive EKG and normal systolic function and wall motion, I don't feel that a LHC is indicated. However, with his multiple cardiac risk factors, including male sex, age, HTN and HLD (both treated), prediabetes (Hgb A1C of 6.0) and strong family h/o of early CAD (father had multiple MIs, the first at age 57), we may need to consider an exercise nuclear stress test for risk stratification. However, this may be done as an outpatient. Will defer final decision to MD.    MD to follow.    LOS: 2 days    Glory Graefe M. Rosita Fire, PA-C 08/03/2013 9:45 AM

## 2013-08-03 NOTE — Progress Notes (Signed)
I have seen and evaluated the patient this PM along with Tarri Fuller, PA. I agree with her findings, examination as well as impression recommendations.  Otherwise relatively healthy gentleman with PMH of HTN & HLD admitted with severe sepsis -- troponin levels + with typical MI trend.  Seen over the weekend by Dr. Radford Pax, who felt that elevated troponin was related to systemic illness.    With concern for potential PE & acute illness, Type II MI is quite likely possible, & with no SSx of CP or Echo WMA, this is certainly possible.    I do agree that he would need OP ST to risk stratify given his age, sex & CRFs (including FH of premature CAD).   Continue BB, ACE-I & statin along with ASA.  Will need Cardiology f/u with Dr. Radford Pax after Myoview.   Leonie Man, M.D., M.S. Interventional Cardiologist  [place] Pager # [phone] 08/03/2013

## 2013-08-03 NOTE — Discharge Summary (Signed)
Physician Discharge Summary  Patient ID: Justin Little MRN: [id number] DOB/AGE: 1945/08/29 68 y.o.  Admit date: 08/01/2013 Discharge date: 08/03/2013    Discharge Diagnoses:  Mild acute resp failure - CAP, PE ruled out.  Mild Respiratory Acidosis, resolved Septic shock, resolved  Mild elevation trop I - likely type 2 NSTEMI secondary to demand ischemia, trop downtrending 3/16 Acute renal failure, nonoliguric, resolved  Lactic acidosis, resolved Elevated LFTs - likely secondary to sepsis. Improving @ time of discharge. Hx of TIA  Hx of restless leg syndrome                                                                      DISCHARGE PLAN BY DIAGNOSIS     Mild acute resp failure - presumed CAP, PE ruled out.  Mild Respiratory Acidosis Plan: - Continue Levaquin for a total of 7 days (stop date of 08/10/13).  Septic shock, resolved  Mild elevation trop I - likely type 2 NSTEMI secondary to demand ischemia, trop downtrending 3/16 Hypertension Plan: - Continue home meds. - Follow up with cardiology as an outpatient for a stress test --> you will be called to set up an appointment. - Follow up with PCP on 08/10/13 at 11:15AM.  Acute renal failure, nonoliguric - improving  Lactic acidosis, resolved Plan: - No interventions required.   Elevated LFTs - likely secondary to septic shock. Plan: - Do not take your pravastatin until you see your PCP.   - PCP will need to repeat LFT's and decide whether you should resume your pravastatin.  Hx of TIA  Hx of restless leg syndrome Plan: - Continue home meds.                  DISCHARGE SUMMARY   Justin Little is a 68 y.o. y/o male with a PMH of HTN, HLD, OSA, RLS, who presented to the ED after being found less responsive by his wife and daughter on 3/14.  In the ED, pt was found to be hypotensive, hypoxic, AKI,  an elevated lactate of 6.21, an elevated troponin of 0.43 (peak 2.76).    Pt was admitted to the ICU for  further workup and medical care. An echo was performed which showed normal LVEF (55 - 60%) with mild to moderate RV dilatation/pressure overload worrisome for PE.  PE workup via VQ scan was negative and LE dopplers were also negative. Pt was treated for severe sepsis likely due to CAP.  Cultures were obtained as listed below and pt was started antibiotics. He was seen by Cardiology who felt that his troponin bump was most likely secondary to type 2 demand ischemia in the setting of septic shock.  EKG showed no signs of ischemia.  Cardiology did not feel that Justin Little was indicated and are considering an exercise nuclear stress test as an outpatient. During hospitalization, statin was held due to transaminitis. Patient is to follow up with PCP as scheduled below and LFT's should be repeated to determine whether statin should be restarted or not. He is to also follow up with cardiology as scheduled below.  In addition, he will be called to set up an outpatient stress test.            SIGNIFICANT  DIAGNOSTIC STUDIES / EVENTS 3/14 mildly elevated TpI. Cards consult  3/14 Echo: LVEF 55-60%. No RWMAs. Normal RV and RVSP estimate. Mildly dilated RA  3/15 LE venous Dopplers: prelim report >>> neg  3/15 V/Q scan: No significant perfusion defects  MICRO DATA  Strep Ag 3/14 >> NEG  Legionella Ag 3/14 >> NEG  RVP 3/14 >>  Resp 3/14 >>  Urine 3/14 >> UA negative  Blood 3/14 >> 1/2 GPC clusters >>   ANTIBIOTICS Azithro 3/14 >>> 3/16 Ceftriaxone 3/14 >>> 3/16 Levofloxacin 3/17 >>> 3/23 stop  CONSULTS Cardiology  TUBES / LINES R IJ CVL 3/14 >> 3/14  Discharge Exam: General: Pleasant male, resting in bed, in NAD. Neuro: A&O x 3, non-focal.  HEENT: [place]/AT. PERRL, sclerae anicteric. Cardiovascular: RRR, no M/R/G. Lungs: Mild rhonchi RLL otherwise CTA. Abdomen: BS x 4, soft, NT/ND.  Musculoskeletal: No gross deformities, no edema.  Skin: Intact, warm, no rashes.    Filed Vitals:   08/02/13 1411  08/02/13 1519 08/02/13 2015 08/03/13 0551  BP: 151/85 141/85 154/89 153/90  Pulse: 99 92 93 83  Temp:  98.4 F (36.9 C) 98.9 F (37.2 C) 97.7 F (36.5 C)  TempSrc:  Oral Oral Oral  Resp:  '20 18 18  ' Height:      Weight:      SpO2:  96% 99% 96%     Discharge Labs  BMET  Recent Labs Lab 08/01/13 1052 08/01/13 1105 08/01/13 1341 08/02/13 0100 08/03/13 0426  NA 146 144  --  142 144  K 4.3 4.0  --  3.9 3.7  CL 98 99  --  105 103  CO2 25  --   --  25 31  GLUCOSE 164* 159*  --  116* 112*  BUN 24* 26*  --  23 10  CREATININE 3.03* 3.30*  --  1.59* 0.72  CALCIUM 8.9  --   --  7.0* 8.0*  MG  --   --  1.1* 1.9  --   PHOS  --   --  3.4  --   --     CBC  Recent Labs Lab 08/01/13 1051 08/01/13 1105 08/02/13 0100 08/03/13 0426  HGB 17.9* 18.7* 14.4 14.8  HCT 52.3* 55.0* 42.6 43.0  WBC 10.7*  --  8.3 7.4  PLT 189  --  151 142*    Anti-Coagulation  Recent Labs Lab 08/01/13 1341 08/01/13 1540  INR 1.84* 1.30     Future Appointments Provider Department Dept Phone   08/17/2013 9:15 AM Sueanne Margarita, MD [place] [phone]           Follow-up Information   Follow up with Gennette Pac, MD On 08/10/2013. (Your appointment is at 11:15AM (with Dr. Radene Ou))    Specialty:  [facility]   Contact information:   [place] [address] [phone]       Follow up with [facility]. (our office will call you for an appointment for a stress test)    Contact information:   [place] [address] [phone]      Follow up with Sueanne Margarita, MD On 08/17/2013. (9:15 am )    Specialty:  Cardiology   Contact information:   [address] [address] [phone]          Medication List    ASK your doctor about these medications  amLODipine 10 MG tablet  Commonly known as:  NORVASC  Take 10 mg by mouth  daily.     benazepril-hydrochlorthiazide 20-12.5 MG per tablet  Commonly known as:  LOTENSIN HCT  Take 2 tablets by mouth daily.     clopidogrel 75 MG tablet  Commonly known as:  PLAVIX  Take 75 mg by mouth daily with breakfast.     potassium chloride 10 MEQ tablet  Commonly known as:  K-DUR  Take 10 mEq by mouth daily.     pravastatin 40 MG tablet  Commonly known as:  PRAVACHOL  Take 40 mg by mouth daily.     rOPINIRole 4 MG tablet  Commonly known as:  REQUIP  Take 4 mg by mouth at bedtime.          Disposition: Home  Discharged Condition: Justin Little has met maximum benefit of inpatient care and is medically stable and cleared for discharge.  Patient is pending follow up as above.      Time spent on disposition:  Greater than 35 minutes.   Montey Hora, PA - [facility] [place] [facility] Pgr: [phone]  or [phone]  PCCM ATTENDING: I have interviewed and examined the patient and reviewed the database. I have formulated the assessment and plan as reflected in the note above with amendments made by me.   He is to complete 7 more days of levofloxacin  He is to resume prior antihypertensive regimen 3/17  He is to hold pravastatin until his LFTs normalize  He will need F/U with his primary care MD, Dr Hulan Fess, in a week at which time he should have LFTs rechecked and decision made re: resumption of statin therapy  He will need outpt F/U with Cardiology for stress test   Merton Border, MD ; [facility] service Mobile [phone].  After 5:30 PM or weekends, call [phone]

## 2013-08-03 NOTE — Progress Notes (Signed)
PULMONARY / CRITICAL CARE MEDICINE   Name: Justin Little MRN: [id number] DOB: 1945/09/21    ADMISSION DATE:  08/01/2013   REFERRING MD :  EDP PRIMARY SERVICE: PCCM  BRIEF PATIENT DESCRIPTION: 64M admitted via ED with 5 days of viral-type prodrome, chest congestion and increased malaise and drowsiness on morning of admission. One episode of scant hemoptysis in ED. Admitted dx of severe sepsis, likely PNA, R/O PE.   SIGNIFICANT EVENTS / STUDIES:  3/14 mildly elevated TpI. Cards consult 3/14 Echo: LVEF 55-60%. No RWMAs. Normal RV and RVSP estimate. Mildly dilated RA 3/15 LE venous Dopplers: prelim report >>> neg 3/15 V/Q scan: No significant perfusion defects  LINES / TUBES: R IJ CVL 3/14 >>   CULTURES: Strep Ag 3/14 >> NEG Legionella Ag 3/14 >> NEG RVP 3/14 >>  Resp 3/14 >> Urine 3/14 >> UA negative Blood 3/14 >> 1/2 GPC clusters >>   ANTIBIOTICS: Azithro 3/14 >> Ceftriaxone 3/14 >>  SUBJECTIVE:  Continued improvement.  No chest pain, SOB, N/V/D.  States that he feels great, would like to go home.  VITAL SIGNS: Temp:  [97.7 F (36.5 C)-98.9 F (37.2 C)] 97.7 F (36.5 C) (03/16 0551) Pulse Rate:  [83-99] 83 (03/16 0551) Resp:  [18-22] 18 (03/16 0551) BP: (136-154)/(83-91) 153/90 mmHg (03/16 0551) SpO2:  [94 %-99 %] 96 % (03/16 0551) HEMODYNAMICS:   VENTILATOR SETTINGS:   INTAKE / OUTPUT: Intake/Output     03/15 0701 - 03/16 0700 03/16 0701 - 03/17 0700   P.O. 480    I.V. (mL/kg) 768.5 (7.1)    IV Piggyback 300    Total Intake(mL/kg) 1548.5 (14.4)    Urine (mL/kg/hr) 2075 (0.8)    Stool 1 (0)    Total Output 2076     Net -527.5          Urine Occurrence 1 x      PHYSICAL EXAMINATION: General: resting in bed, in NAD. Neuro: no focal deficits HEENT:  WNL Cardiovascular: RRR s M Lungs: mild RLL rhonchi otherwise CTA. Abdomen: Soft nt +bs Ext: warm, no edema  LABS: I have reviewed all of today's lab results. Relevant abnormalities are discussed  in the A/P section  CXR:  Minimal RLL AS dz  ASSESSMENT / PLAN:  PULMONARY A:  Mild acute resp failure - suspect CAP, PE ruled out. Mild Respiratory Acidosis P:   Cont supplemental O2  CARDIOVASCULAR A:  Septic shock, resolved Mild elevation trop I - likely type 2 NSTEMI secondary to demand ischemia, trop downtrending 3/16 P:  Cont cardiac monitoring Add metoprolol Cont clopidogrel and ASA Holding statin due to elevated LFTs, will resume on d/c Cards following, possible exercise nuclear stress test as an outpatient.  RENAL A: Acute renal failure, nonoliguric - improving Lactic acidosis, resolved P:   Monitor BMET intermittently Monitor I/Os Correct electrolytes as indicated  GASTROINTESTINAL A:   Elevated LFTs - likely secondary to septic shock. P:   SUP N/I Cont PO diet Monitor LFTs intermittently - eval further if nonresolving  HEMATOLOGIC A:  Hemoconcentration, resolved P:  DVT px: enoxaparin Monitor cbc intermittently  INFECTIOUS A:   Presumed CAP 1/2 positive blood culture - suspect contaminant P:   Micro and abx as above Since clinically much improved, will not add vanc empirically  NEUROLOGIC A:   Hx of TIA Hx of restless leg syndrome P:   Cont current Rx  GLOBAL:  Appears to be much improved.  If cards is OK with outpatient stress test, could  possibly d/c home today.  Will discuss with attending.   Montey Hora, PA - [facility] [place] [facility] Pgr: [phone]  or [phone]   PCCM ATTENDING: I have interviewed and examined the patient and reviewed the database. I have formulated the assessment and plan as reflected in the note above with amendments made by me.   From my perspective, he can be discharged to home today if OK with Cardiology.  He is to complete 7 more days of levofloxacin He is to resume prior antihypertensive regimen 3/17 He is to hold pravastatin until his LFTs normalize  He will need F/U with  his primary care MD, Dr Hulan Fess, in a week at which time he should have LFTs rechecked and decision made re: resumption of statin therapy  He will need outpt F/U with Cardiology for stress test   Merton Border, MD;  PCCM service; Mobile [phone]

## 2013-08-04 LAB — CULTURE, RESPIRATORY W GRAM STAIN

## 2013-08-04 LAB — CULTURE, RESPIRATORY

## 2013-08-07 LAB — CULTURE, BLOOD (ROUTINE X 2): Culture: NO GROWTH

## 2013-08-10 ENCOUNTER — Encounter (HOSPITAL_COMMUNITY): Payer: Medicare Other

## 2013-08-10 DIAGNOSIS — R748 Abnormal levels of other serum enzymes: Secondary | ICD-10-CM | POA: Diagnosis not present

## 2013-08-10 DIAGNOSIS — J189 Pneumonia, unspecified organism: Secondary | ICD-10-CM | POA: Diagnosis not present

## 2013-08-17 ENCOUNTER — Ambulatory Visit (INDEPENDENT_AMBULATORY_CARE_PROVIDER_SITE_OTHER): Payer: Medicare Other | Admitting: Cardiology

## 2013-08-17 ENCOUNTER — Encounter: Payer: Self-pay | Admitting: Cardiology

## 2013-08-17 ENCOUNTER — Encounter (HOSPITAL_COMMUNITY): Payer: Medicare Other

## 2013-08-17 VITALS — BP 130/80 | HR 84 | Ht 71.0 in | Wt 224.0 lb

## 2013-08-17 DIAGNOSIS — I10 Essential (primary) hypertension: Secondary | ICD-10-CM | POA: Diagnosis not present

## 2013-08-17 DIAGNOSIS — I214 Non-ST elevation (NSTEMI) myocardial infarction: Secondary | ICD-10-CM

## 2013-08-17 DIAGNOSIS — I517 Cardiomegaly: Secondary | ICD-10-CM | POA: Diagnosis not present

## 2013-08-17 NOTE — Progress Notes (Signed)
[place], [place], [place]  [address] Phone: [phone] Fax:  [phone]  Date:  08/17/2013   ID:  Justin Little, DOB 1945/12/17, MRN [id number]  PCP:  Gennette Pac, MD  Cardiologist:  Fransico Him, MD     History of Present Illness: Justin Little is a 68 y.o. male with a PMH of HTN, HLD, OSA, RLS, who presented to the ED after being found less responsive by his wife and daughter on 3/14. In the ED, pt was found to be hypotensive, hypoxic, AKI, an elevated lactate of 6.21, an elevated troponin of 0.43 (peak 2.76). Pt was admitted to the ICU for further workup and medical care. An echo was performed which showed normal LVEF (55 - 60%) with mild to moderate RV dilatation/pressure overload worrisome for PE. PE workup via VQ scan was negative and LE dopplers were also negative. Pt was treated for severe sepsis likely due to CAP. Cultures were obtained as listed below and pt was started antibiotics. He was seen by Cardiology who felt that his troponin bump was most likely secondary to type 2 demand ischemia in the setting of septic shock. EKG showed no signs of ischemia. Cardiology did not feel that [place] was indicated and are considering an exercise nuclear stress test as an outpatient. During hospitalization, statin was held due to transaminitis.  Patient is to follow up with PCP as scheduled below and LFT's should be repeated to determine whether statin should be restarted or not. He now presents back to Cardiology for further evaluation and outpt nuclear stress test. He denies any chest pain or pressure and he has not had any SOB since resolution of his PNA.  He denies any LE edema.     Wt Readings from Last 3 Encounters:  08/17/13 224 lb (101.606 kg)  08/02/13 237 lb 3.4 oz (107.6 kg)  06/01/13 224 lb (101.606 kg)     Past Medical History  Diagnosis Date  . Sleep apnea   . Hypertension   . High cholesterol   . Restless leg syndrome   . Degenerative  arthritis   . Restless leg syndrome   . High cholesterol     Current Outpatient Prescriptions  Medication Sig Dispense Refill  . amLODipine (NORVASC) 10 MG tablet Take 10 mg by mouth daily.      Marland Kitchen aspirin EC 81 MG EC tablet Take 1 tablet (81 mg total) by mouth daily.  30 tablet  0  . benazepril-hydrochlorthiazide (LOTENSIN HCT) 20-12.5 MG per tablet Take 2 tablets by mouth daily.      . clopidogrel (PLAVIX) 75 MG tablet Take 75 mg by mouth daily with breakfast.      . levofloxacin (LEVAQUIN) 750 MG tablet Take 1 tablet (750 mg total) by mouth daily.  7 tablet  0  . metoprolol succinate (TOPROL-XL) 50 MG 24 hr tablet Take 1 tablet (50 mg total) by mouth daily. Take with or immediately following a meal.  30 tablet  0  . potassium chloride (K-DUR) 10 MEQ tablet Take 10 mEq by mouth daily.      Marland Kitchen rOPINIRole (REQUIP) 4 MG tablet Take 4 mg by mouth at bedtime.       No current facility-administered medications for this visit.    Allergies:   No Known Allergies  Social History:  The patient  reports that he has quit smoking. His smoking use included Cigarettes. He smoked 0.00 packs per day. He has never used smokeless tobacco. He  reports that he drinks about 12.6 ounces of alcohol per week. He reports that he does not use illicit drugs.   Family History:  The patient's family history includes Cancer in his mother; Heart Problems in his father; High blood pressure in his mother.   ROS:  Please see the history of present illness.      All other systems reviewed and negative.   PHYSICAL EXAM: VS:  BP 130/80  Pulse 84  Ht 5\' 11"  (1.803 m)  Wt 224 lb (101.606 kg)  BMI 31.26 kg/m2 Well nourished, well developed, in no acute distress HEENT: normal Neck: no JVD Cardiac:  normal S1, S2; RRR; no murmur Lungs:  clear to auscultation bilaterally, no wheezing, rhonchi or rales Abd: soft, nontender, no hepatomegaly Ext: no edema Skin: warm and dry Neuro:  CNs 2-12 intact, no focal abnormalities  noted       ASSESSMENT AND PLAN:  1. Recent NSTEMI in the setting of septic shock and PNA felt secondary to demand ischemia from profound shock.  His echo showed normal LVF with no RWMA's.  It also showed right sided enlargement felt secondary to acute hypoxia from PNA.  PE was ruled out. - will get a Lexiscan myoview to rule out ischemia 2.  Enlarged right sided chambers by echo most likely secondary to acute hypoxia - recheck echo to assess RV dilatation 3.  HTN well controlled - continue metoprolol/amlodipine/Lotensin  Followup with PRN pending results of the studies  Signed, Fransico Him, MD 08/17/2013 9:43 AM

## 2013-08-17 NOTE — Patient Instructions (Addendum)
Your physician recommends that you continue on your current medications as directed. Please refer to the Current Medication list given to you today.  Your physician has requested that you have an echocardiogram. Echocardiography is a painless test that uses sound waves to create images of your heart. It provides your doctor with information about the size and shape of your heart and how well your heart's chambers and valves are working. This procedure takes approximately one hour. There are no restrictions for this procedure. ( Pt is scheduled for Myoview on 08/19/13. If possible try to schedule same day)  Your physician recommends that you schedule a follow-up appointment as needed with Dr Radford Pax

## 2013-08-19 ENCOUNTER — Ambulatory Visit (HOSPITAL_COMMUNITY): Payer: Medicare Other | Attending: Cardiology | Admitting: Radiology

## 2013-08-19 ENCOUNTER — Encounter: Payer: Self-pay | Admitting: Cardiology

## 2013-08-19 VITALS — BP 134/71 | Ht 71.0 in | Wt 220.0 lb

## 2013-08-19 DIAGNOSIS — I4510 Unspecified right bundle-branch block: Secondary | ICD-10-CM | POA: Insufficient documentation

## 2013-08-19 DIAGNOSIS — I252 Old myocardial infarction: Secondary | ICD-10-CM | POA: Diagnosis not present

## 2013-08-19 DIAGNOSIS — I10 Essential (primary) hypertension: Secondary | ICD-10-CM | POA: Insufficient documentation

## 2013-08-19 DIAGNOSIS — R0601 Orthopnea: Secondary | ICD-10-CM | POA: Diagnosis not present

## 2013-08-19 DIAGNOSIS — R55 Syncope and collapse: Secondary | ICD-10-CM | POA: Insufficient documentation

## 2013-08-19 DIAGNOSIS — Z8249 Family history of ischemic heart disease and other diseases of the circulatory system: Secondary | ICD-10-CM | POA: Insufficient documentation

## 2013-08-19 DIAGNOSIS — I214 Non-ST elevation (NSTEMI) myocardial infarction: Secondary | ICD-10-CM

## 2013-08-19 DIAGNOSIS — I2510 Atherosclerotic heart disease of native coronary artery without angina pectoris: Secondary | ICD-10-CM

## 2013-08-19 DIAGNOSIS — Z87891 Personal history of nicotine dependence: Secondary | ICD-10-CM | POA: Insufficient documentation

## 2013-08-19 MED ORDER — TECHNETIUM TC 99M SESTAMIBI GENERIC - CARDIOLITE
33.0000 | Freq: Once | INTRAVENOUS | Status: AC | PRN
  Administered 2013-08-19: 33 via INTRAVENOUS

## 2013-08-19 MED ORDER — TECHNETIUM TC 99M SESTAMIBI GENERIC - CARDIOLITE
10.8000 | Freq: Once | INTRAVENOUS | Status: AC | PRN
  Administered 2013-08-19: 11 via INTRAVENOUS

## 2013-08-19 MED ORDER — REGADENOSON 0.4 MG/5ML IV SOLN
0.4000 mg | Freq: Once | INTRAVENOUS | Status: AC
  Administered 2013-08-19: 0.4 mg via INTRAVENOUS

## 2013-08-19 NOTE — Progress Notes (Signed)
  [facility] [address] [place], [place] [address] [phone]    Cardiology Nuclear Med Study  ALAA EYERMAN is a 68 y.o. male     MRN : [id number]     DOB: [birth year]  Procedure Date: 08/19/2013  Nuclear Med Background Indication for Stress Test:  Evaluation for Ischemia and Post Hospital:08/01/13 NSTEMI and Septic Shock History:MI ?NSTEMI/SEPTIC SHOCK;ECHO (08/02/13)EF:55-60% Cardiac Risk Factors: Family History - CAD, History of Smoking, Hypertension, Lipids and RBBB  Symptoms:  Syncope   Nuclear Pre-Procedure Caffeine/Decaff Intake:  7:00pm NPO After: 8:30pm   Lungs:  clear O2 Sat: 95% on room air. IV 0.9% NS with Angio Cath:  20g  IV Site: R Hand  IV Started by:  Matilde Haymaker, RN  Chest Size (in):  46 Cup Size: n/a  Height: 5\' 11"  (1.803 m)  Weight:  220 lb (99.791 kg)  BMI:  Body mass index is 30.7 kg/(m^2). Tech Comments:  No Toprol x 48 hrs    Nuclear Med Study 1 or 2 day study: 1 day  Stress Test Type:  Lexiscan  Reading MD: n/a  Order Authorizing Provider:  Tressia Miners Turner,MD  Resting Radionuclide: Technetium 78m Sestamibi  Resting Radionuclide Dose: 11.0 mCi   Stress Radionuclide:  Technetium 60m Sestamibi  Stress Radionuclide Dose: 33.0 mCi           Stress Protocol Rest HR: 78 Stress HR: 106  Rest BP: 134/71 Stress BP: 133/69  Exercise Time (min): n/a METS: n/a   Predicted Max HR: 153 bpm % Max HR: 69.28 bpm Rate Pressure Product: 14098   Dose of Adenosine (mg):  n/a Dose of Lexiscan: 0.4 mg  Dose of Atropine (mg): n/a Dose of Dobutamine: n/a mcg/kg/min (at max HR)  Stress Test Technologist: Ileene Hutchinson, EMT-P  Nuclear Technologist:  Charlton Amor, CNMT     Rest Procedure:  Myocardial perfusion imaging was performed at rest 45 minutes following the intravenous administration of Technetium 14m Sestamibi. Rest ECG: NSR, RAD, non-specific T wave abnormalities  Stress Procedure:  The patient received IV  Lexiscan 0.4 mg over 15-seconds.  Technetium 14m Sestamibi injected at 30-seconds.  Quantitative spect images were obtained after a 45 minute delay. Stress ECG: No significant change from baseline ECG  QPS Raw Data Images:  Normal; no motion artifact; normal heart/lung ratio. Stress Images:  There is decrease uptake in the mid and basal inferior and basal inferoseptal walls.  Rest Images:  There is decrease uptake in the mid and basal inferior and basal inferoseptal walls.  Subtraction (SDS):  No evidence of ischemia. Transient Ischemic Dilatation (Normal <1.22):  1.06 Lung/Heart Ratio (Normal <0.45):  0.34  Quantitative Gated Spect Images QGS EDV:  n/a  QGS ESV:  n/a  Impression Exercise Capacity:  Lexiscan with no exercise. BP Response:  Normal blood pressure response. Clinical Symptoms:  No symptoms. ECG Impression:  No significant ST segment change suggestive of ischemia. 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 No ischemia..  LV Ejection Fraction: Study not gated.  LV Wall Motion:  Study not gated  Dorothy Spark 08/19/2013

## 2013-08-20 DIAGNOSIS — M25569 Pain in unspecified knee: Secondary | ICD-10-CM | POA: Diagnosis not present

## 2013-08-24 ENCOUNTER — Telehealth: Payer: Self-pay | Admitting: Cardiology

## 2013-08-24 NOTE — Telephone Encounter (Signed)
Returned pts call with results.  

## 2013-08-24 NOTE — Telephone Encounter (Signed)
New message   Pt called to receive stress test results.. Please call

## 2013-08-26 DIAGNOSIS — J189 Pneumonia, unspecified organism: Secondary | ICD-10-CM | POA: Diagnosis not present

## 2013-08-26 DIAGNOSIS — Z23 Encounter for immunization: Secondary | ICD-10-CM | POA: Diagnosis not present

## 2013-08-26 DIAGNOSIS — G459 Transient cerebral ischemic attack, unspecified: Secondary | ICD-10-CM | POA: Diagnosis not present

## 2013-08-26 DIAGNOSIS — R748 Abnormal levels of other serum enzymes: Secondary | ICD-10-CM | POA: Diagnosis not present

## 2013-08-27 ENCOUNTER — Ambulatory Visit (HOSPITAL_COMMUNITY): Payer: Medicare Other | Attending: Cardiology | Admitting: Radiology

## 2013-08-27 ENCOUNTER — Encounter: Payer: Self-pay | Admitting: Cardiology

## 2013-08-27 DIAGNOSIS — R578 Other shock: Secondary | ICD-10-CM

## 2013-08-27 DIAGNOSIS — I517 Cardiomegaly: Secondary | ICD-10-CM | POA: Diagnosis not present

## 2013-08-27 NOTE — Progress Notes (Signed)
Echocardiogram performed.  

## 2013-09-01 DIAGNOSIS — M25569 Pain in unspecified knee: Secondary | ICD-10-CM | POA: Diagnosis not present

## 2013-10-08 ENCOUNTER — Other Ambulatory Visit: Payer: Self-pay | Admitting: *Deleted

## 2013-10-08 MED ORDER — METOPROLOL SUCCINATE ER 50 MG PO TB24: 50.0000 mg | ORAL_TABLET | Freq: Every day | ORAL | Status: AC

## 2013-12-09 DIAGNOSIS — Z79899 Other long term (current) drug therapy: Secondary | ICD-10-CM | POA: Diagnosis not present

## 2013-12-09 DIAGNOSIS — Z125 Encounter for screening for malignant neoplasm of prostate: Secondary | ICD-10-CM | POA: Diagnosis not present

## 2013-12-09 DIAGNOSIS — E782 Mixed hyperlipidemia: Secondary | ICD-10-CM | POA: Diagnosis not present

## 2014-02-08 DIAGNOSIS — J3489 Other specified disorders of nose and nasal sinuses: Secondary | ICD-10-CM | POA: Diagnosis not present

## 2014-02-17 DIAGNOSIS — I10 Essential (primary) hypertension: Secondary | ICD-10-CM | POA: Diagnosis not present

## 2014-05-10 DIAGNOSIS — J069 Acute upper respiratory infection, unspecified: Secondary | ICD-10-CM | POA: Diagnosis not present

## 2014-07-07 DIAGNOSIS — G4733 Obstructive sleep apnea (adult) (pediatric): Secondary | ICD-10-CM | POA: Diagnosis not present

## 2014-08-10 ENCOUNTER — Ambulatory Visit (INDEPENDENT_AMBULATORY_CARE_PROVIDER_SITE_OTHER): Payer: Medicare Other | Admitting: Neurology

## 2014-08-10 ENCOUNTER — Encounter: Payer: Self-pay | Admitting: Neurology

## 2014-08-10 VITALS — BP 140/80 | HR 76 | Temp 98.5°F | Resp 20 | Ht 71.0 in | Wt 234.6 lb

## 2014-08-10 DIAGNOSIS — R295 Transient paralysis: Secondary | ICD-10-CM

## 2014-08-10 DIAGNOSIS — R5383 Other fatigue: Secondary | ICD-10-CM | POA: Diagnosis not present

## 2014-08-10 NOTE — Progress Notes (Signed)
NEUROLOGY FOLLOW UP OFFICE NOTE  Justin Little [id number]  HISTORY OF PRESENT ILLNESS: Justin Little is a 69 year old right-handed man with history of OSA, restless leg syndrome, hypertension and hyperlipidemia who follows up for episode of transient paralysis.  Last office visit was in January 2015.    UPDATE: CTA of the head from January 2015 was negative.  He had been doing well.  The episodes are not frequent.  His last spell was last week.  He was sitting outside at 8 pm talking with a friend.  He started to feel tired.  He went in to take his ropinirole and went back outside.  Then the episode occurred.  It lasted a little longer this time, about 5 minutes.  EMS arrived.  He was lucid but couldn't talk, open his eyes or move his extremities.  He was able to smile and squeeze his hands on command.  HISTORY:   The first spell occurred in June 2014.  He took his ropinirole 4mg  that night for his restless leg syndrome.  While taking to someone about an hour later, he became less responsive and closes his eyes, like he was in a sleep.  He was not sleepy and there is no warning.  He is conscious and could hear people talking to him but he was unable to respond.  His eyes are typically closed but he does recall once looking over at his wife.  He does not lose tone but he cannot move.  With effort, he attempts to move but can't.  There were no convulsions, focal abnormal movements, facial numbness, strange olfactory sensation, visual disturbance or sensation of epigastric rising.  It usually lasts less than 2 minutes, except on one or two occasions, it exceeded this.  A workup at the ED revealed normal CT of the brain and normal CBC and CMP, including K.  ETOH level was 74.  He has had several other spells since then.  One time, it occurred while playing cards with friends and another time it occurred while watching TV.  At first, it was thought to be a side effect of his ropinirole, which he  took for RLS, because it occurred about an hour after taking it.  However, subsequent spells occurred at different times of day and not right after taking the medication.  He notes that it usually happens when he is feeling bored.  He has a history of OSA and uses a CPAP.  He feels rested.  No history of seizures.  He was seen by Dr. Krista Blue at [facility].  He had an EEG performed on 12/05/12, which was normal.  MRI of the brain with and without contrast was performed on 01/07/13, which revealed small vessel ischemic changes but no clinically significant abnormalities.  A carotid doppler was performed on 05/06/13, which revealed less than 50% stenosis of both ICAs, but nothing hemodynamically significant.  PAST MEDICAL HISTORY: Past Medical History  Diagnosis Date  . Sleep apnea   . High cholesterol   . Restless leg syndrome   . Degenerative arthritis   . Restless leg syndrome   . High cholesterol   . Hypertension     MEDICATIONS: Current Outpatient Prescriptions on File Prior to Visit  Medication Sig Dispense Refill  . amLODipine (NORVASC) 10 MG tablet Take 10 mg by mouth daily.    . benazepril-hydrochlorthiazide (LOTENSIN HCT) 20-12.5 MG per tablet Take 2 tablets by mouth daily.    . clopidogrel (PLAVIX) 75  MG tablet Take 75 mg by mouth daily with breakfast.    . metoprolol succinate (TOPROL-XL) 50 MG 24 hr tablet Take 1 tablet (50 mg total) by mouth daily. Take with or immediately following a meal. 30 tablet 11  . rOPINIRole (REQUIP) 4 MG tablet Take 4 mg by mouth at bedtime.    Marland Kitchen aspirin EC 81 MG EC tablet Take 1 tablet (81 mg total) by mouth daily. (Patient not taking: Reported on 08/10/2014) 30 tablet 0  . levofloxacin (LEVAQUIN) 750 MG tablet Take 1 tablet (750 mg total) by mouth daily. (Patient not taking: Reported on 08/10/2014) 7 tablet 0  . potassium chloride (K-DUR) 10 MEQ tablet Take 10 mEq by mouth daily.     No current facility-administered medications on file  prior to visit.    ALLERGIES: No Known Allergies  FAMILY HISTORY: Family History  Problem Relation Age of Onset  . Heart Problems Father   . High blood pressure Mother   . Cancer Mother     SOCIAL HISTORY: History   Social History  . Marital Status: Married    Spouse Name: [name]  . Number of Children: 2  . Years of Education: college   Occupational History  .      Sales   Social History Main Topics  . Smoking status: Former Smoker    Types: Cigarettes  . Smokeless tobacco: Never Used     Comment: Quit 1985  . Alcohol Use: 12.6 oz/week    21 Shots of liquor per week     Comment: daily scotch-6 0z/day per patient.  . Drug Use: No  . Sexual Activity:    Partners: Female   Other Topics Concern  . Not on file   Social History Narrative   Patient lives at home with his wife [name]). Patient is still working works with [employer].    Right handed.   Caffeine - None    REVIEW OF SYSTEMS: Constitutional: No fevers, chills, or sweats, no generalized fatigue, change in appetite Eyes: No visual changes, double vision, eye pain Ear, nose and throat: No hearing loss, ear pain, nasal congestion, sore throat Cardiovascular: No chest pain, palpitations Respiratory:  No shortness of breath at rest or with exertion, wheezes GastrointestinaI: No nausea, vomiting, diarrhea, abdominal pain, fecal incontinence Genitourinary:  No dysuria, urinary retention or frequency Musculoskeletal:  No neck pain, back pain Integumentary: No rash, pruritus, skin lesions Neurological: as above Psychiatric: No depression, insomnia, anxiety Endocrine: No palpitations, fatigue, diaphoresis, mood swings, change in appetite, change in weight, increased thirst Hematologic/Lymphatic:  No anemia, purpura, petechiae. Allergic/Immunologic: no itchy/runny eyes, nasal congestion, recent allergic reactions, rashes  PHYSICAL EXAM: Filed Vitals:   08/10/14 1448  BP: 140/80  Pulse: 76  Temp: 98.5 F  (36.9 C)  Resp: 20   General: No acute distress Head:  Normocephalic/atraumatic Eyes:  Fundoscopic exam unremarkable without vessel changes, exudates, hemorrhages or papilledema. Neck: supple, no paraspinal tenderness, full range of motion Heart:  Regular rate and rhythm Lungs:  Clear to auscultation bilaterally Back: No paraspinal tenderness Neurological Exam: alert and oriented to person, place, and time. Attention span and concentration intact, recent and remote memory intact, fund of knowledge intact.  Speech fluent and not dysarthric, language intact.  CN II-XII intact. Fundoscopic exam unremarkable without vessel changes, exudates, hemorrhages or papilledema.  Bulk and tone normal, muscle strength 5/5 throughout.  Sensation to light touch, temperature and vibration intact.  Deep tendon reflexes 2+ throughout.  Finger to nose testing intact.  Gait normal  IMPRESSION: Transient paralysis.  It is not really altered mental status, because he is lucid.  It is unusual presentation for seizure.  Not characteristic for a sleep disorder.  He is older than typical onset of a channelopathy, such as hypokalemic periodic paralysis (also prior labs were unremarkable).  PLAN: At this point, I really don't have an answer to what this is.  He will be going away on vacation for a week.  I will check a TSH.  I will confer with my colleagues to advise any further workup.  15 minutes spent with patient, over 50% spent discussing differential diagnoses and plan.  Metta Clines, DO  CC:  Hulan Fess, MD

## 2014-08-10 NOTE — Patient Instructions (Signed)
I am perplexed.  Go enjoy your vacation.  In the meantime, I will discuss your case with colleagues.  Follow up in 2 weeks to discuss next steps.

## 2014-08-11 ENCOUNTER — Telehealth: Payer: Self-pay | Admitting: *Deleted

## 2014-08-11 LAB — TSH: TSH: 1.248 u[IU]/mL (ref 0.350–4.500)

## 2014-08-11 NOTE — Telephone Encounter (Signed)
Patient is aware of normal TSH

## 2014-08-11 NOTE — Telephone Encounter (Signed)
-----   Message from Pieter Partridge, DO sent at 08/11/2014  6:40 AM EDT ----- TSH is normal ----- Message -----    From: [facility] Interface    Sent: 08/11/2014   2:47 AM      To: Pieter Partridge, DO

## 2014-09-03 ENCOUNTER — Ambulatory Visit: Payer: BLUE CROSS/BLUE SHIELD | Admitting: Neurology

## 2014-09-07 ENCOUNTER — Encounter: Payer: Self-pay | Admitting: Neurology

## 2014-09-07 ENCOUNTER — Ambulatory Visit (INDEPENDENT_AMBULATORY_CARE_PROVIDER_SITE_OTHER): Payer: Medicare Other | Admitting: Neurology

## 2014-09-07 VITALS — BP 140/70 | HR 70 | Resp 16 | Ht 71.0 in | Wt 233.0 lb

## 2014-09-07 DIAGNOSIS — R295 Transient paralysis: Secondary | ICD-10-CM

## 2014-09-07 NOTE — Patient Instructions (Signed)
At this point, I am not sure what to make of these unusual effects. 1.  We will set you up for a 48 hour ambulatory EEG.  When you have it on, try to trigger an event (staying up late and talking with people).  Also try to record it if it happens. 2.  In the meantime, I would probably not drive.  [place] law states that if you have an unprovoked event where you lose awareness or control, you should not drive for 6 months (so that would be 5 more months) 3. Follow up afterwards.

## 2014-09-07 NOTE — Progress Notes (Signed)
NEUROLOGY FOLLOW UP OFFICE NOTE  [name] [id number]  HISTORY OF PRESENT ILLNESS: Justin Little is a 69 year old right-handed man with history of OSA, restless leg syndrome, hypertension and hyperlipidemia who follows up for episode of transient paralysis.  He is accompanied by his wife who provides some history.  UPDATE: TSH was 1.248.  He has not had any recurrent spells.  I discussed with my colleagues and semiology is unusual.  He has OSA and recently saw his sleep specialist in February.  Everything seemed okay and CPAP settings were correct.  HISTORY:    The first spell occurred in June 2014.  He took his ropinirole 4mg  that night for his restless leg syndrome.  While taking to someone about an hour later, he became less responsive and closes his eyes, like he was in a sleep.  He was not sleepy and there is no warning.  He is conscious and could hear people talking to him but he was unable to respond.  His eyes are typically closed but he does recall once looking over at his wife.  He does not lose tone but he cannot move.  With effort, he attempts to move but can't.  There were no convulsions, focal abnormal movements, facial numbness, strange olfactory sensation, visual disturbance or sensation of epigastric rising.  It usually lasts less than 2 minutes, except on one or two occasions, it exceeded this.  A workup at the ED revealed normal CT of the brain and normal CBC and CMP, including K.  ETOH level was 74.  He has had several other spells since then.  One time, it occurred while playing cards with friends and another time it occurred while watching TV.  At first, it was thought to be a side effect of his ropinirole, which he took for RLS, because it occurred about an hour after taking it.  However, subsequent spells occurred at different times of day and not right after taking the medication.  He notes that it usually happens when he is feeling bored.  Last event occurred 4  weeks ago.  He has a history of OSA and uses a CPAP.  He feels rested.  No history of seizures.  He had an EEG performed on 12/05/12, which was normal.  MRI of the brain with and without contrast was performed on 01/07/13, which revealed small vessel ischemic changes but no clinically significant abnormalities.  A carotid doppler was performed on 05/06/13, which revealed less than 50% stenosis of both ICAs, but nothing hemodynamically significant.  CTA of the head from January 2015 was negative  PAST MEDICAL HISTORY: Past Medical History  Diagnosis Date  . Sleep apnea   . High cholesterol   . Restless leg syndrome   . Degenerative arthritis   . Restless leg syndrome   . High cholesterol   . Hypertension     MEDICATIONS: Current Outpatient Prescriptions on File Prior to Visit  Medication Sig Dispense Refill  . amLODipine (NORVASC) 10 MG tablet Take 10 mg by mouth daily.    . clopidogrel (PLAVIX) 75 MG tablet Take 75 mg by mouth daily with breakfast.    . metoprolol succinate (TOPROL-XL) 50 MG 24 hr tablet Take 1 tablet (50 mg total) by mouth daily. Take with or immediately following a meal. 30 tablet 11  . potassium chloride (K-DUR) 10 MEQ tablet Take 10 mEq by mouth daily.    Marland Kitchen rOPINIRole (REQUIP) 4 MG tablet Take 4 mg by mouth at  bedtime.    Marland Kitchen aspirin EC 81 MG EC tablet Take 1 tablet (81 mg total) by mouth daily. (Patient not taking: Reported on 09/07/2014) 30 tablet 0  . benazepril-hydrochlorthiazide (LOTENSIN HCT) 20-12.5 MG per tablet Take 2 tablets by mouth daily.    Marland Kitchen levofloxacin (LEVAQUIN) 750 MG tablet Take 1 tablet (750 mg total) by mouth daily. (Patient not taking: Reported on 09/07/2014) 7 tablet 0   No current facility-administered medications on file prior to visit.    ALLERGIES: No Known Allergies  FAMILY HISTORY: Family History  Problem Relation Age of Onset  . Heart Problems Father   . High blood pressure Mother   . Cancer Mother     SOCIAL HISTORY: History     Social History  . Marital Status: Married    Spouse Name: Justin Little  . Number of Children: 2  . Years of Education: college   Occupational History  .      Sales   Social History Main Topics  . Smoking status: Former Smoker    Types: Cigarettes  . Smokeless tobacco: Never Used     Comment: Quit 1985  . Alcohol Use: 12.6 oz/week    21 Shots of liquor per week     Comment: daily scotch-6 0z/day per patient.  . Drug Use: No  . Sexual Activity:    Partners: Female   Other Topics Concern  . Not on file   Social History Narrative   Patient lives at home with his wife Justin Little). Patient is still working works with [employer].    Right handed.   Caffeine - None    REVIEW OF SYSTEMS: Constitutional: No fevers, chills, or sweats, no generalized fatigue, change in appetite Eyes: No visual changes, double vision, eye pain Ear, nose and throat: No hearing loss, ear pain, nasal congestion, sore throat Cardiovascular: No chest pain, palpitations Respiratory:  No shortness of breath at rest or with exertion, wheezes GastrointestinaI: No nausea, vomiting, diarrhea, abdominal pain, fecal incontinence Genitourinary:  No dysuria, urinary retention or frequency Musculoskeletal:  No neck pain, back pain Integumentary: No rash, pruritus, skin lesions Neurological: as above Psychiatric: No depression, insomnia, anxiety Endocrine: No palpitations, fatigue, diaphoresis, mood swings, change in appetite, change in weight, increased thirst Hematologic/Lymphatic:  No anemia, purpura, petechiae. Allergic/Immunologic: no itchy/runny eyes, nasal congestion, recent allergic reactions, rashes  PHYSICAL EXAM: Filed Vitals:   09/07/14 0746  BP: 140/70  Pulse: 70  Resp: 16   General: No acute distress Head:  Normocephalic/atraumatic  IMPRESSION: Transient altered sensorium.  It does not occur often.  Unclear etiology, unusual semiology  PLAN: We will hook him up to a 48 hour ambulatory EEG.  We  will also have him try to trigger an event (it usually occurs late at night when he is interacting with people, particularly if he is feeling bored).  I also informed him about [place] law stating that a person with an unprovoked event of altered awareness or control should not drive for 6 months from last spell.  He will follow up after testing.  15 minutes spent with patient, 100% spent discussing possible diagnoses and plan.  Metta Clines, DO  CC:  Hulan Fess, MD

## 2014-11-01 ENCOUNTER — Ambulatory Visit (INDEPENDENT_AMBULATORY_CARE_PROVIDER_SITE_OTHER): Payer: Medicare Other | Admitting: Neurology

## 2014-11-01 DIAGNOSIS — R295 Transient paralysis: Secondary | ICD-10-CM

## 2014-11-09 IMAGING — CT CT HEAD W/O CM
2 series · 16 of 30 positions shown, 20 images · non-contrast
Comparison: None

CLINICAL DATA: Near-syncope, history hypertension

CT HEAD WITHOUT CONTRAST
TECHNIQUE: Contiguous axial images were obtained from the base of
the skull through the vertex without contrast.

[Series 2: head w/o · axial · non-contrast · 0.43mm/px · z∈[-190,-65]mm · 13 of 31 slices shown, 17 images]
[im 3/31  brain]
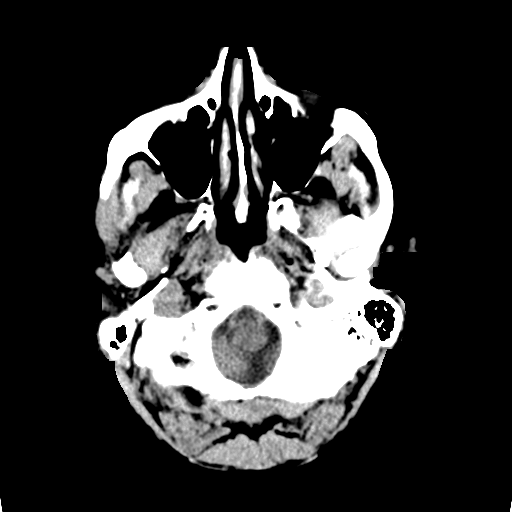
[im 3/31  bone]
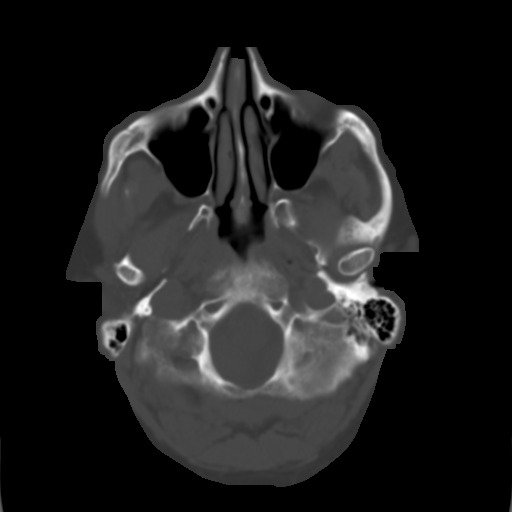
[im 5/31  brain]
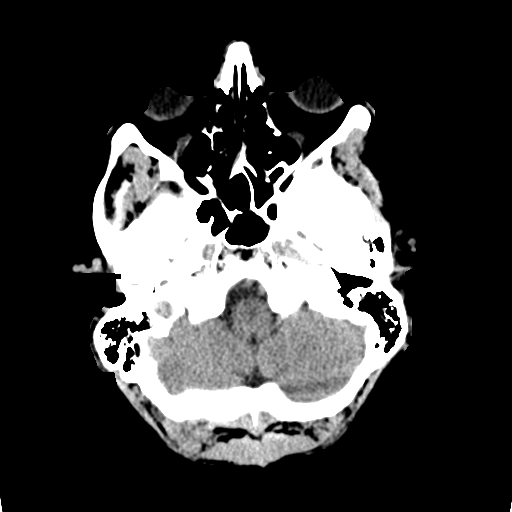
[im 7/31  brain]
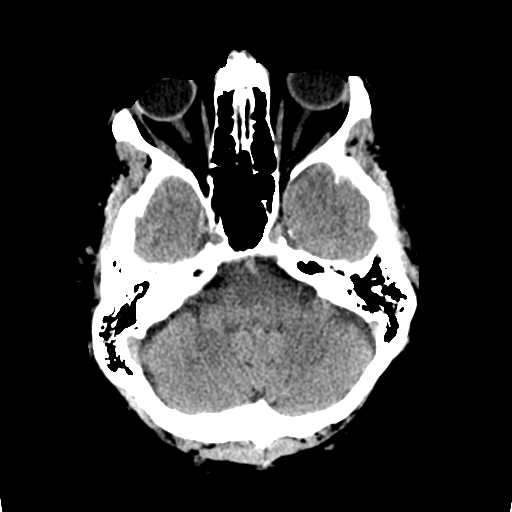
[im 9/31  brain]
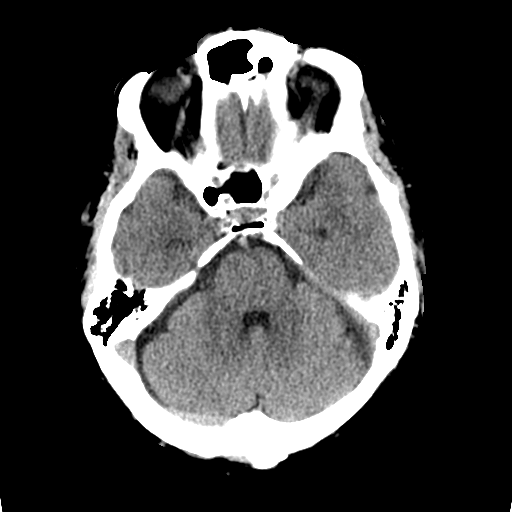
[im 11/31  brain]
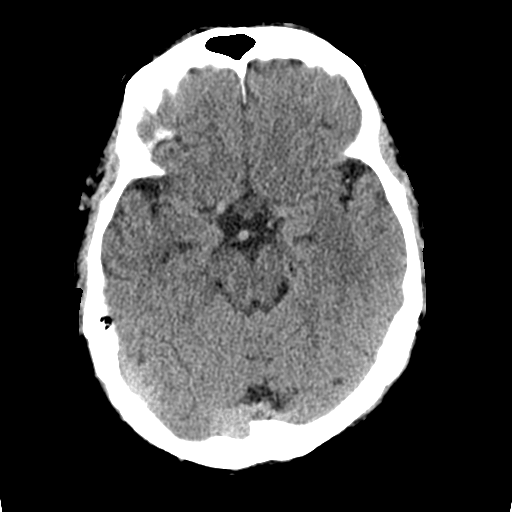
[im 11/31  bone]
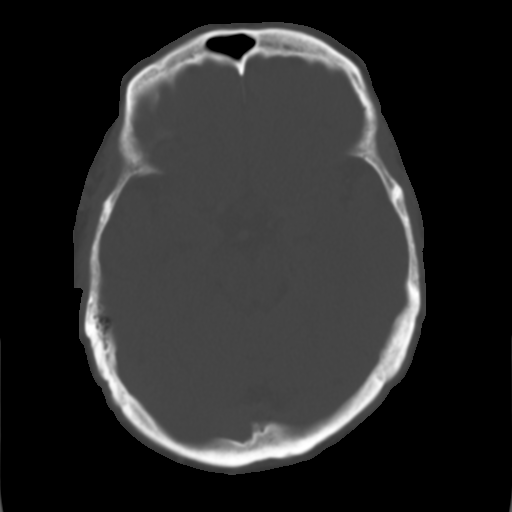
[im 13/31  brain]
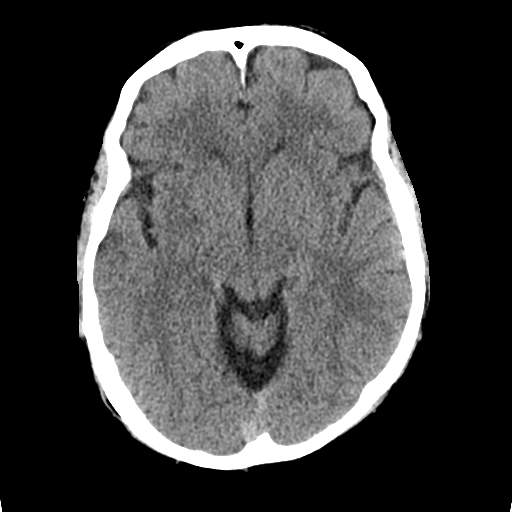
[im 16/31  brain]
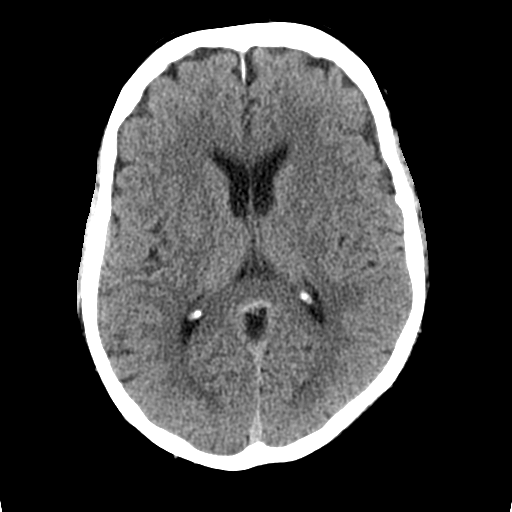
[im 18/31  brain]
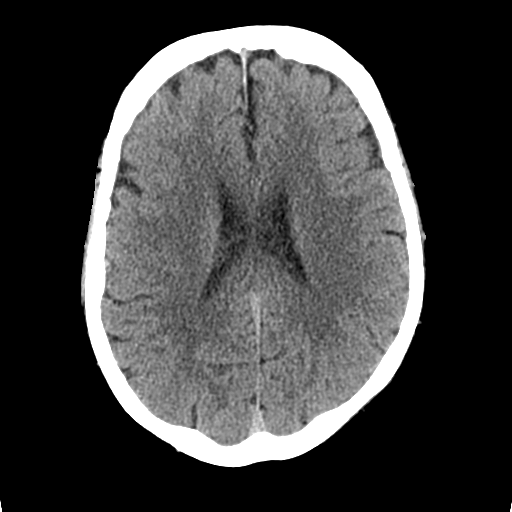
[im 20/31  brain]
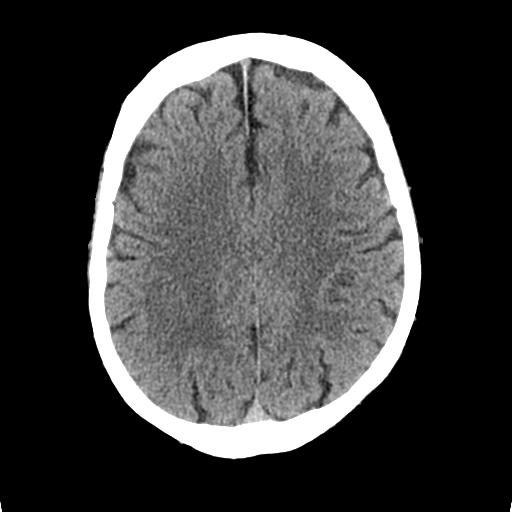
[im 20/31  bone]
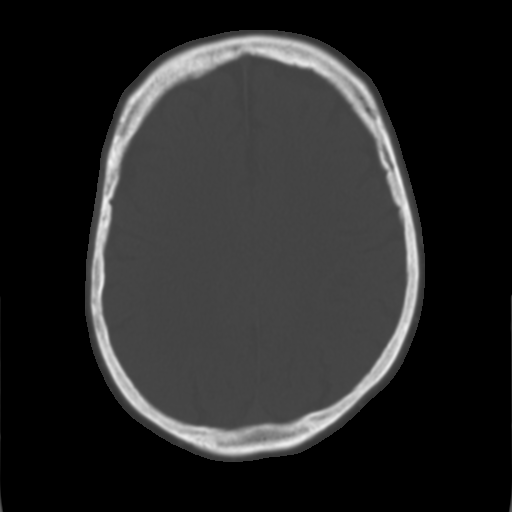
[im 22/31  brain]
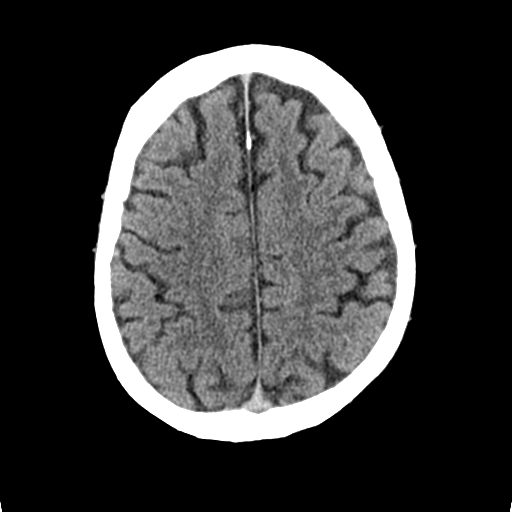
[im 24/31  brain]
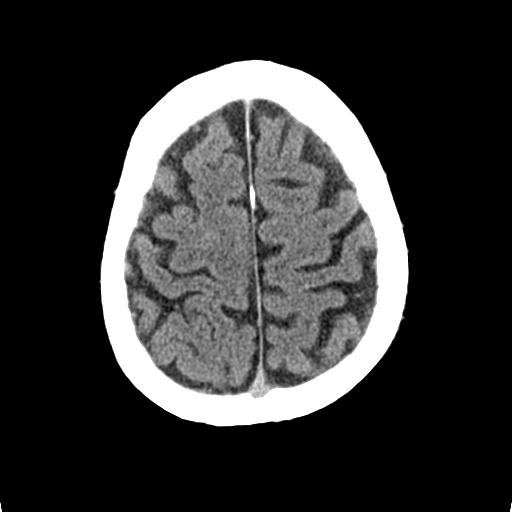
[im 26/31  brain]
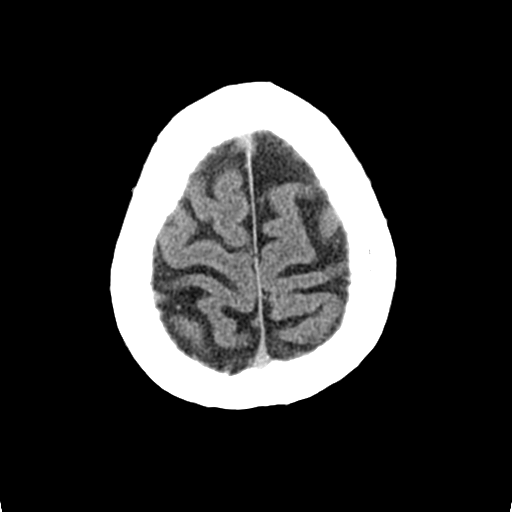
[im 28/31  brain]
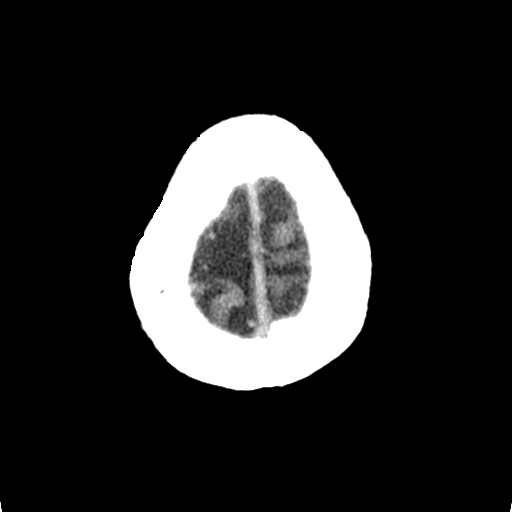
[im 28/31  bone]
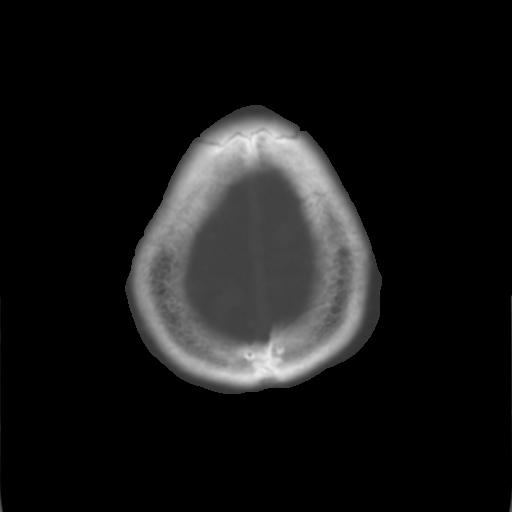

[Series 3: bone windows · axial · 0.43mm/px · z∈[-190,-150]mm · 3 of 31 slices shown]
[im 3/31  bone]
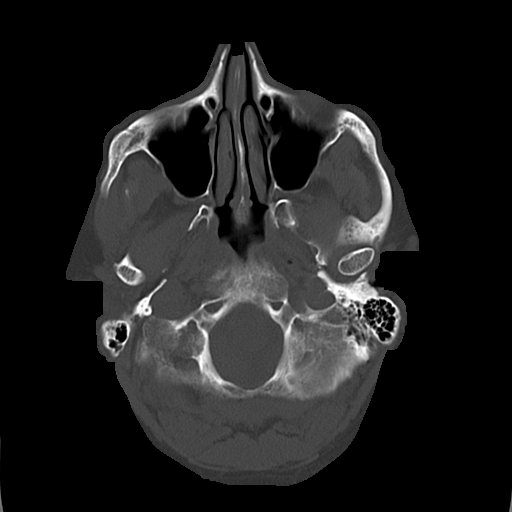
[im 7/31  bone]
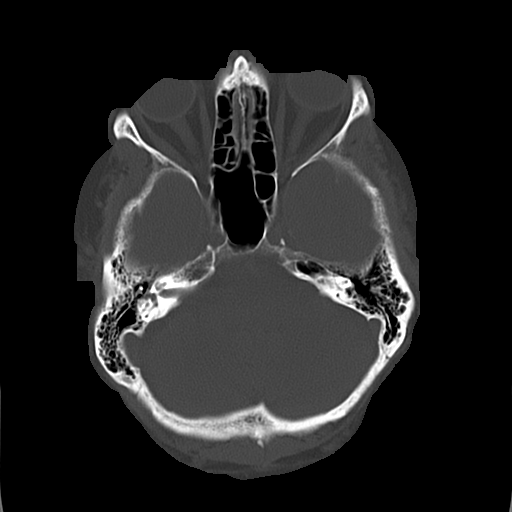
[im 11/31  bone]
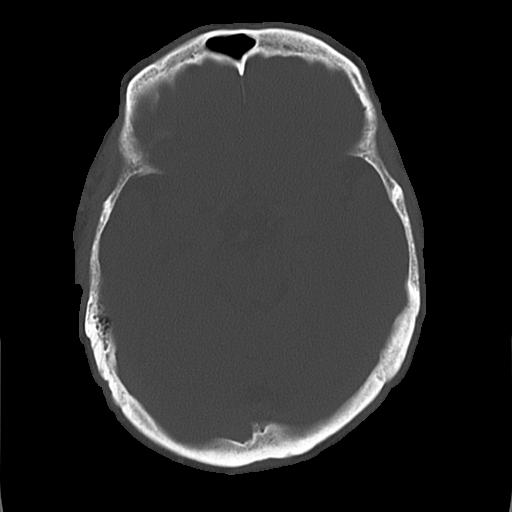

[16 of 30 positions shown; findings below may reference images not displayed]

FINDINGS: Normal ventricular morphology.
No midline shift or mass effect.
Normal appearance of brain parenchyma.
No intracranial hemorrhage, mass lesion, or evidence of acute
infarction.
No extra-axial fluid collections.
Bones sinuses unremarkable.
IMPRESSION: No acute intracranial abnormalities.

## 2014-11-10 ENCOUNTER — Telehealth: Payer: Self-pay | Admitting: Neurology

## 2014-11-10 NOTE — Telephone Encounter (Signed)
Pt had a EEG done on 11-01-14 and would like the results please call [phone]

## 2014-11-10 NOTE — Procedures (Signed)
[facility] REPORT  Date of Study: 11/01/14 to 11/03/14  Patient's Name: Justin Little MRN: [id number] Date of Birth: 02-Jun-1945  Indication: Transient altered sensorium  Medications: Requip Plavix Norvasc  Technical Summary: This is a multichannel digital EEG recording, using the international 10-20 placement system.  Spike detection software was employed.  Description: The EEG background is symmetric, with a well-developed posterior dominant rhythm of 10 Hz, which is reactive to eye opening and closing.  Diffuse beta activity is seen, with a bilateral frontal preponderance.  No focal or generalized abnormalities are seen.  No focal or generalized epileptiform discharges are seen.  None of the patient's habitual spells were captured.  Stage II sleep is seen, with normal and symmetric sleep patterns.  Hyperventilation and photic stimulation were not performed.  ECG revealed normal cardiac rate and rhythm.  Impression: This is a normal 48 hour ambulator EEG of the awake and asleep states, without activating procedures.  A normal study does not rule out the possibility of a seizure disorder in this patient.  Alannah Averhart R. Tomi Likens, DO

## 2014-11-10 NOTE — Telephone Encounter (Signed)
EEG normal  

## 2014-11-10 NOTE — Telephone Encounter (Signed)
Do we have results of EEG ? I do not see it resulted to me

## 2014-11-10 NOTE — Telephone Encounter (Signed)
Patient is aware of eeg results he will also make a follow visit.

## 2014-11-21 DIAGNOSIS — R55 Syncope and collapse: Secondary | ICD-10-CM | POA: Diagnosis not present

## 2014-11-24 ENCOUNTER — Other Ambulatory Visit: Payer: Self-pay | Admitting: *Deleted

## 2014-11-24 ENCOUNTER — Encounter: Payer: Self-pay | Admitting: Neurology

## 2014-11-24 ENCOUNTER — Ambulatory Visit (INDEPENDENT_AMBULATORY_CARE_PROVIDER_SITE_OTHER): Payer: Medicare Other | Admitting: Neurology

## 2014-11-24 ENCOUNTER — Telehealth: Payer: Self-pay | Admitting: *Deleted

## 2014-11-24 VITALS — BP 130/84 | HR 68 | Resp 16 | Wt 231.3 lb

## 2014-11-24 DIAGNOSIS — R295 Transient paralysis: Secondary | ICD-10-CM | POA: Insufficient documentation

## 2014-11-24 NOTE — Progress Notes (Signed)
NEUROLOGY FOLLOW UP OFFICE NOTE  Justin Little [id number]  HISTORY OF PRESENT ILLNESS: Justin Little is a 69 year old right-handed man with history of OSA, restless leg syndrome, hypertension and hyperlipidemia who follows up for recurrent episodes of transient paralysis.    UPDATE: He had a 48 hour ambulatory EEG from 6/13 to 11/03/14.  It was a normal study, however his habitual event was not captured.  He had another spell this past weekend.  Again, he appeared unresponsive with eyes closed but he was completely aware.  It lasted for 12 minutes.  EMS came and he had a normal EKG.  He refused transport to the ED.  This was his first spell since April.  HISTORY:    The first spell occurred in June 2014.  He took his ropinirole 4mg  that night for his restless leg syndrome.  While taking to someone about an hour later, he became less responsive and closes his eyes, like he was in a sleep.  He was not sleepy and there is no warning.  He is conscious and could hear people talking to him but he was unable to respond.  His eyes are typically closed but he does recall once looking over at his wife.  He does not lose tone but he cannot move.  With effort, he attempts to move but can't.  There were no convulsions, focal abnormal movements, facial numbness, strange olfactory sensation, visual disturbance or sensation of epigastric rising.  It usually lasts less than 2 minutes, except on one or two occasions, it exceeded this.  A workup at the ED revealed normal CT of the brain and normal CBC and CMP, including K.  ETOH level was 74.  He has had several other spells since then.  One time, it occurred while playing cards and talking with friends and another time it occurred while watching TV.  At first, it was thought to be a side effect of his ropinirole, which he took for RLS, because it occurred about an hour after taking it.  However, subsequent spells occurred at different times of day and not  right after taking the medication.  He notes that it usually happens when he is feeling bored.    He has a history of OSA and uses a CPAP. He saw his sleep specialist in February.  Everything seemed okay and CPAP settings were correct. He feels rested.  No history of seizures.  He had an EEG performed on 12/05/12, which was normal.  MRI of the brain with and without contrast was performed on 01/07/13, which revealed small vessel ischemic changes but no clinically significant abnormalities.  A carotid doppler was performed on 05/06/13, which revealed less than 50% stenosis of both ICAs, but nothing hemodynamically significant.  CTA of the head from January 2015 was negative.  PAST MEDICAL HISTORY: Past Medical History  Diagnosis Date  . Sleep apnea   . High cholesterol   . Restless leg syndrome   . Degenerative arthritis   . Restless leg syndrome   . High cholesterol   . Hypertension     MEDICATIONS: Current Outpatient Prescriptions on File Prior to Visit  Medication Sig Dispense Refill  . amLODipine (NORVASC) 10 MG tablet Take 10 mg by mouth daily.    Marland Kitchen aspirin EC 81 MG EC tablet Take 1 tablet (81 mg total) by mouth daily. 30 tablet 0  . benazepril-hydrochlorthiazide (LOTENSIN HCT) 20-12.5 MG per tablet Take 2 tablets by mouth daily.    Marland Kitchen  clopidogrel (PLAVIX) 75 MG tablet Take 75 mg by mouth daily with breakfast.    . levofloxacin (LEVAQUIN) 750 MG tablet Take 1 tablet (750 mg total) by mouth daily. 7 tablet 0  . metoprolol succinate (TOPROL-XL) 50 MG 24 hr tablet Take 1 tablet (50 mg total) by mouth daily. Take with or immediately following a meal. 30 tablet 11  . potassium chloride (K-DUR) 10 MEQ tablet Take 10 mEq by mouth daily.    Marland Kitchen rOPINIRole (REQUIP) 4 MG tablet Take 4 mg by mouth at bedtime.     No current facility-administered medications on file prior to visit.    ALLERGIES: No Known Allergies  FAMILY HISTORY: Family History  Problem Relation Age of Onset  . Heart  Problems Father   . High blood pressure Mother   . Cancer Mother     SOCIAL HISTORY: History   Social History  . Marital Status: Married    Spouse Name: Justin Little  . Number of Children: 2  . Years of Education: college   Occupational History  .      Sales   Social History Main Topics  . Smoking status: Former Smoker    Types: Cigarettes  . Smokeless tobacco: Never Used     Comment: Quit 1985  . Alcohol Use: 12.6 oz/week    21 Shots of liquor per week     Comment: daily scotch-6 0z/day per patient.  . Drug Use: No  . Sexual Activity:    Partners: Female   Other Topics Concern  . Not on file   Social History Narrative   Patient lives at home with his wife Justin Little). Patient is still working works with [employer].    Right handed.   Caffeine - None    REVIEW OF SYSTEMS: Constitutional: No fevers, chills, or sweats, no generalized fatigue, change in appetite Eyes: No visual changes, double vision, eye pain Ear, nose and throat: No hearing loss, ear pain, nasal congestion, sore throat Cardiovascular: No chest pain, palpitations Respiratory:  No shortness of breath at rest or with exertion, wheezes GastrointestinaI: No nausea, vomiting, diarrhea, abdominal pain, fecal incontinence Genitourinary:  No dysuria, urinary retention or frequency Musculoskeletal:  No neck pain, back pain Integumentary: No rash, pruritus, skin lesions Neurological: as above Psychiatric: No depression, insomnia, anxiety Endocrine: No palpitations, fatigue, diaphoresis, mood swings, change in appetite, change in weight, increased thirst Hematologic/Lymphatic:  No anemia, purpura, petechiae. Allergic/Immunologic: no itchy/runny eyes, nasal congestion, recent allergic reactions, rashes  PHYSICAL EXAM: Filed Vitals:   11/24/14 0747  BP: 130/84  Pulse: 68  Resp: 16   General: No acute distress Head:  Normocephalic/atraumatic Eyes:  Fundoscopic exam unremarkable without vessel changes, exudates,  hemorrhages or papilledema. Neck: supple, no paraspinal tenderness, full range of motion Heart:  Regular rate and rhythm Lungs:  Clear to auscultation bilaterally Back: No paraspinal tenderness Neurological Exam: alert and oriented to person, place, and time. Attention span and concentration intact, recent and remote memory intact, fund of knowledge intact.  Speech fluent and not dysarthric, language intact.  CN II-XII intact. Fundoscopic exam unremarkable without vessel changes, exudates, hemorrhages or papilledema.  Bulk and tone normal, muscle strength 5/5 throughout.  Sensation to light touch, temperature and vibration intact.  Deep tendon reflexes 2+ throughout, toes downgoing.  Finger to nose and heel to shin testing intact.  Gait normal, Romberg negative.  IMPRESSION: Recurrent transient paralysis.  Etiology unclear.  Semiology is not typical for seizures.  Age of onset of these spells do not  correlate with the familial transient paralysis syndromes.  PLAN: I am not really sure where to go from here.  I will refer him to Dr. Jacelyn Grip, an epileptologist at [facility], for consultation.   In the meantime, I informed him that he should not drive for at least 6 months from last event. Follow up after visit with Dr. Jacelyn Grip  15 minutes spent face to face with patient, 100% spent discussing possible diagnoses and plan.   Metta Clines, DO  CC:  Hulan Fess, MD

## 2014-11-24 NOTE — Patient Instructions (Signed)
I would like to refer you to Dr. Neena Rhymes at [facility] for evaluation of these events In the meantime, I recommend that you not drive for at least 6 months since your last episode. Follow up after seeing Dr. Jacelyn Grip.

## 2014-11-24 NOTE — Telephone Encounter (Signed)
Patient is aware he has appt with Dr Fabio Asa [facility]  [place]  On  03/25/15 at 9 am record have been faxed

## 2014-11-24 NOTE — Addendum Note (Signed)
Addended by: Charyl Bigger E on: 11/24/2014 09:01 AM   Modules accepted: Orders

## 2015-01-26 DIAGNOSIS — E780 Pure hypercholesterolemia: Secondary | ICD-10-CM | POA: Diagnosis not present

## 2015-01-26 DIAGNOSIS — R295 Transient paralysis: Secondary | ICD-10-CM | POA: Diagnosis not present

## 2015-01-26 DIAGNOSIS — Z23 Encounter for immunization: Secondary | ICD-10-CM | POA: Diagnosis not present

## 2015-01-26 DIAGNOSIS — I10 Essential (primary) hypertension: Secondary | ICD-10-CM | POA: Diagnosis not present

## 2015-01-27 ENCOUNTER — Other Ambulatory Visit: Payer: Self-pay | Admitting: Family Medicine

## 2015-01-27 DIAGNOSIS — E780 Pure hypercholesterolemia: Secondary | ICD-10-CM | POA: Diagnosis not present

## 2015-01-27 DIAGNOSIS — I10 Essential (primary) hypertension: Secondary | ICD-10-CM | POA: Diagnosis not present

## 2015-01-27 DIAGNOSIS — R295 Transient paralysis: Secondary | ICD-10-CM

## 2015-01-31 ENCOUNTER — Ambulatory Visit
Admission: RE | Admit: 2015-01-31 | Discharge: 2015-01-31 | Disposition: A | Discharge status: Home / Self Care | Payer: Medicare Other | Source: Ambulatory Visit | Attending: Family Medicine | Admitting: Family Medicine

## 2015-01-31 DIAGNOSIS — R295 Transient paralysis: Secondary | ICD-10-CM

## 2015-01-31 DIAGNOSIS — I6523 Occlusion and stenosis of bilateral carotid arteries: Secondary | ICD-10-CM | POA: Diagnosis not present

## 2015-03-16 DIAGNOSIS — L570 Actinic keratosis: Secondary | ICD-10-CM | POA: Diagnosis not present

## 2015-03-16 DIAGNOSIS — C44311 Basal cell carcinoma of skin of nose: Secondary | ICD-10-CM | POA: Diagnosis not present

## 2015-03-24 DIAGNOSIS — G479 Sleep disorder, unspecified: Secondary | ICD-10-CM | POA: Diagnosis not present

## 2015-03-24 DIAGNOSIS — R6889 Other general symptoms and signs: Secondary | ICD-10-CM | POA: Diagnosis not present

## 2015-04-21 DIAGNOSIS — C44311 Basal cell carcinoma of skin of nose: Secondary | ICD-10-CM | POA: Diagnosis not present

## 2015-06-23 DIAGNOSIS — R05 Cough: Secondary | ICD-10-CM | POA: Diagnosis not present

## 2015-07-11 DIAGNOSIS — G4733 Obstructive sleep apnea (adult) (pediatric): Secondary | ICD-10-CM | POA: Diagnosis not present

## 2015-07-11 DIAGNOSIS — G2581 Restless legs syndrome: Secondary | ICD-10-CM | POA: Diagnosis not present

## 2015-07-25 DIAGNOSIS — R0982 Postnasal drip: Secondary | ICD-10-CM | POA: Diagnosis not present

## 2015-07-25 DIAGNOSIS — R05 Cough: Secondary | ICD-10-CM | POA: Diagnosis not present

## 2016-01-18 DIAGNOSIS — G2581 Restless legs syndrome: Secondary | ICD-10-CM | POA: Diagnosis not present

## 2016-01-18 DIAGNOSIS — R404 Transient alteration of awareness: Secondary | ICD-10-CM | POA: Diagnosis not present

## 2016-03-14 DIAGNOSIS — R7301 Impaired fasting glucose: Secondary | ICD-10-CM | POA: Diagnosis not present

## 2016-03-14 DIAGNOSIS — G4730 Sleep apnea, unspecified: Secondary | ICD-10-CM | POA: Diagnosis not present

## 2016-03-14 DIAGNOSIS — I10 Essential (primary) hypertension: Secondary | ICD-10-CM | POA: Diagnosis not present

## 2016-03-14 DIAGNOSIS — Z86010 Personal history of colonic polyps: Secondary | ICD-10-CM | POA: Diagnosis not present

## 2016-03-14 DIAGNOSIS — Z125 Encounter for screening for malignant neoplasm of prostate: Secondary | ICD-10-CM | POA: Diagnosis not present

## 2016-03-30 DIAGNOSIS — J069 Acute upper respiratory infection, unspecified: Secondary | ICD-10-CM | POA: Diagnosis not present

## 2016-04-20 DIAGNOSIS — I10 Essential (primary) hypertension: Secondary | ICD-10-CM | POA: Diagnosis not present

## 2016-04-20 DIAGNOSIS — Z125 Encounter for screening for malignant neoplasm of prostate: Secondary | ICD-10-CM | POA: Diagnosis not present

## 2016-04-20 DIAGNOSIS — R7301 Impaired fasting glucose: Secondary | ICD-10-CM | POA: Diagnosis not present

## 2016-04-20 DIAGNOSIS — Z86010 Personal history of colonic polyps: Secondary | ICD-10-CM | POA: Diagnosis not present

## 2016-04-20 DIAGNOSIS — G4730 Sleep apnea, unspecified: Secondary | ICD-10-CM | POA: Diagnosis not present

## 2016-07-11 DIAGNOSIS — G4733 Obstructive sleep apnea (adult) (pediatric): Secondary | ICD-10-CM | POA: Diagnosis not present

## 2016-07-21 DIAGNOSIS — G4733 Obstructive sleep apnea (adult) (pediatric): Secondary | ICD-10-CM | POA: Diagnosis not present

## 2016-08-06 DIAGNOSIS — G4733 Obstructive sleep apnea (adult) (pediatric): Secondary | ICD-10-CM | POA: Diagnosis not present

## 2016-09-13 DIAGNOSIS — L570 Actinic keratosis: Secondary | ICD-10-CM | POA: Diagnosis not present

## 2016-09-13 DIAGNOSIS — L409 Psoriasis, unspecified: Secondary | ICD-10-CM | POA: Diagnosis not present

## 2016-11-09 DIAGNOSIS — L209 Atopic dermatitis, unspecified: Secondary | ICD-10-CM | POA: Diagnosis not present

## 2016-11-09 DIAGNOSIS — D229 Melanocytic nevi, unspecified: Secondary | ICD-10-CM | POA: Diagnosis not present

## 2017-01-14 DIAGNOSIS — G478 Other sleep disorders: Secondary | ICD-10-CM | POA: Diagnosis not present

## 2017-01-14 DIAGNOSIS — R404 Transient alteration of awareness: Secondary | ICD-10-CM | POA: Diagnosis not present

## 2017-01-14 DIAGNOSIS — G2581 Restless legs syndrome: Secondary | ICD-10-CM | POA: Diagnosis not present

## 2017-03-04 NOTE — Telephone Encounter (Signed)
Close Encounter 

## 2017-07-09 DIAGNOSIS — J069 Acute upper respiratory infection, unspecified: Secondary | ICD-10-CM | POA: Diagnosis not present

## 2017-08-22 DIAGNOSIS — G2581 Restless legs syndrome: Secondary | ICD-10-CM | POA: Diagnosis not present

## 2017-08-22 DIAGNOSIS — R7301 Impaired fasting glucose: Secondary | ICD-10-CM | POA: Diagnosis not present

## 2017-08-22 DIAGNOSIS — I10 Essential (primary) hypertension: Secondary | ICD-10-CM | POA: Diagnosis not present

## 2017-08-22 DIAGNOSIS — Z125 Encounter for screening for malignant neoplasm of prostate: Secondary | ICD-10-CM | POA: Diagnosis not present

## 2017-08-22 DIAGNOSIS — D751 Secondary polycythemia: Secondary | ICD-10-CM | POA: Diagnosis not present

## 2017-08-22 DIAGNOSIS — Z86010 Personal history of colonic polyps: Secondary | ICD-10-CM | POA: Diagnosis not present

## 2017-08-22 DIAGNOSIS — E7800 Pure hypercholesterolemia, unspecified: Secondary | ICD-10-CM | POA: Diagnosis not present

## 2017-08-22 DIAGNOSIS — G4730 Sleep apnea, unspecified: Secondary | ICD-10-CM | POA: Diagnosis not present

## 2017-12-30 DIAGNOSIS — D126 Benign neoplasm of colon, unspecified: Secondary | ICD-10-CM | POA: Diagnosis not present

## 2017-12-30 DIAGNOSIS — K5730 Diverticulosis of large intestine without perforation or abscess without bleeding: Secondary | ICD-10-CM | POA: Diagnosis not present

## 2017-12-30 DIAGNOSIS — Z86010 Personal history of colonic polyps: Secondary | ICD-10-CM | POA: Diagnosis not present

## 2018-01-01 DIAGNOSIS — D126 Benign neoplasm of colon, unspecified: Secondary | ICD-10-CM | POA: Diagnosis not present

## 2018-11-17 DIAGNOSIS — R7301 Impaired fasting glucose: Secondary | ICD-10-CM | POA: Diagnosis not present

## 2018-11-17 DIAGNOSIS — Z125 Encounter for screening for malignant neoplasm of prostate: Secondary | ICD-10-CM | POA: Diagnosis not present

## 2018-11-17 DIAGNOSIS — I10 Essential (primary) hypertension: Secondary | ICD-10-CM | POA: Diagnosis not present

## 2018-11-20 DIAGNOSIS — Z8673 Personal history of transient ischemic attack (TIA), and cerebral infarction without residual deficits: Secondary | ICD-10-CM | POA: Diagnosis not present

## 2018-11-20 DIAGNOSIS — Z125 Encounter for screening for malignant neoplasm of prostate: Secondary | ICD-10-CM | POA: Diagnosis not present

## 2018-11-20 DIAGNOSIS — G4730 Sleep apnea, unspecified: Secondary | ICD-10-CM | POA: Diagnosis not present

## 2018-11-20 DIAGNOSIS — Z0000 Encounter for general adult medical examination without abnormal findings: Secondary | ICD-10-CM | POA: Diagnosis not present

## 2018-11-20 DIAGNOSIS — R7301 Impaired fasting glucose: Secondary | ICD-10-CM | POA: Diagnosis not present

## 2018-11-20 DIAGNOSIS — I10 Essential (primary) hypertension: Secondary | ICD-10-CM | POA: Diagnosis not present

## 2018-11-20 DIAGNOSIS — H6121 Impacted cerumen, right ear: Secondary | ICD-10-CM | POA: Diagnosis not present

## 2018-11-20 DIAGNOSIS — G2581 Restless legs syndrome: Secondary | ICD-10-CM | POA: Diagnosis not present

## 2018-11-20 DIAGNOSIS — D751 Secondary polycythemia: Secondary | ICD-10-CM | POA: Diagnosis not present

## 2018-11-20 DIAGNOSIS — H6120 Impacted cerumen, unspecified ear: Secondary | ICD-10-CM | POA: Diagnosis not present

## 2018-11-20 DIAGNOSIS — E7800 Pure hypercholesterolemia, unspecified: Secondary | ICD-10-CM | POA: Diagnosis not present

## 2018-11-20 DIAGNOSIS — Z86010 Personal history of colonic polyps: Secondary | ICD-10-CM | POA: Diagnosis not present

## 2018-11-27 ENCOUNTER — Other Ambulatory Visit: Payer: Self-pay | Admitting: Family Medicine

## 2018-11-27 DIAGNOSIS — Z87891 Personal history of nicotine dependence: Secondary | ICD-10-CM

## 2018-12-10 ENCOUNTER — Ambulatory Visit
Admission: RE | Admit: 2018-12-10 | Discharge: 2018-12-10 | Disposition: A | Discharge status: Home / Self Care | Payer: PPO | Source: Ambulatory Visit | Attending: Family Medicine | Admitting: Family Medicine

## 2018-12-10 ENCOUNTER — Other Ambulatory Visit: Payer: Self-pay

## 2018-12-10 DIAGNOSIS — Z136 Encounter for screening for cardiovascular disorders: Secondary | ICD-10-CM | POA: Diagnosis not present

## 2018-12-10 DIAGNOSIS — Z87891 Personal history of nicotine dependence: Secondary | ICD-10-CM | POA: Diagnosis not present

## 2019-02-20 DIAGNOSIS — G4730 Sleep apnea, unspecified: Secondary | ICD-10-CM | POA: Diagnosis not present

## 2019-02-20 DIAGNOSIS — Z86010 Personal history of colonic polyps: Secondary | ICD-10-CM | POA: Diagnosis not present

## 2019-02-20 DIAGNOSIS — E7800 Pure hypercholesterolemia, unspecified: Secondary | ICD-10-CM | POA: Diagnosis not present

## 2019-02-20 DIAGNOSIS — G2581 Restless legs syndrome: Secondary | ICD-10-CM | POA: Diagnosis not present

## 2019-02-20 DIAGNOSIS — D751 Secondary polycythemia: Secondary | ICD-10-CM | POA: Diagnosis not present

## 2019-02-20 DIAGNOSIS — H6120 Impacted cerumen, unspecified ear: Secondary | ICD-10-CM | POA: Diagnosis not present

## 2019-02-20 DIAGNOSIS — R7301 Impaired fasting glucose: Secondary | ICD-10-CM | POA: Diagnosis not present

## 2019-02-20 DIAGNOSIS — I10 Essential (primary) hypertension: Secondary | ICD-10-CM | POA: Diagnosis not present

## 2019-02-20 DIAGNOSIS — Z125 Encounter for screening for malignant neoplasm of prostate: Secondary | ICD-10-CM | POA: Diagnosis not present

## 2019-02-20 DIAGNOSIS — Z8673 Personal history of transient ischemic attack (TIA), and cerebral infarction without residual deficits: Secondary | ICD-10-CM | POA: Diagnosis not present

## 2019-02-20 DIAGNOSIS — Z0000 Encounter for general adult medical examination without abnormal findings: Secondary | ICD-10-CM | POA: Diagnosis not present

## 2019-03-02 DIAGNOSIS — Z23 Encounter for immunization: Secondary | ICD-10-CM | POA: Diagnosis not present

## 2019-06-15 DIAGNOSIS — E782 Mixed hyperlipidemia: Secondary | ICD-10-CM | POA: Diagnosis not present

## 2019-06-15 DIAGNOSIS — I10 Essential (primary) hypertension: Secondary | ICD-10-CM | POA: Diagnosis not present

## 2019-06-18 ENCOUNTER — Ambulatory Visit: Payer: PPO

## 2019-06-26 ENCOUNTER — Ambulatory Visit: Payer: PPO

## 2019-06-28 ENCOUNTER — Ambulatory Visit: Payer: PPO | Attending: Internal Medicine

## 2019-06-28 DIAGNOSIS — Z23 Encounter for immunization: Secondary | ICD-10-CM

## 2019-06-28 NOTE — Progress Notes (Signed)
   Covid-19 Vaccination Clinic  Name:  Justin Little    MRN: [id number] DOB: [birth year]  06/28/2019  Justin Little was observed post Covid-19 immunization for 15 minutes without incidence. He was provided with Vaccine Information Sheet and instruction to access the V-Safe system.   Justin Little was instructed to call 911 with any severe reactions post vaccine: Marland Kitchen Difficulty breathing  . Swelling of your face and throat  . A fast heartbeat  . A bad rash all over your body  . Dizziness and weakness    Immunizations Administered    Name Date Dose VIS Date Route   Pfizer COVID-19 Vaccine 06/28/2019  9:14 AM 0.3 mL 05/01/2019 Intramuscular   Manufacturer: [place]   Lot: [id number]   [place]: [id number]

## 2019-06-30 DIAGNOSIS — E782 Mixed hyperlipidemia: Secondary | ICD-10-CM | POA: Diagnosis not present

## 2019-06-30 DIAGNOSIS — E7800 Pure hypercholesterolemia, unspecified: Secondary | ICD-10-CM | POA: Diagnosis not present

## 2019-06-30 DIAGNOSIS — I10 Essential (primary) hypertension: Secondary | ICD-10-CM | POA: Diagnosis not present

## 2019-07-01 DIAGNOSIS — Z20822 Contact with and (suspected) exposure to covid-19: Secondary | ICD-10-CM | POA: Diagnosis not present

## 2019-07-01 DIAGNOSIS — Z03818 Encounter for observation for suspected exposure to other biological agents ruled out: Secondary | ICD-10-CM | POA: Diagnosis not present

## 2019-07-01 DIAGNOSIS — Z20828 Contact with and (suspected) exposure to other viral communicable diseases: Secondary | ICD-10-CM | POA: Diagnosis not present

## 2019-07-22 ENCOUNTER — Ambulatory Visit: Payer: PPO | Attending: Internal Medicine

## 2019-07-22 DIAGNOSIS — Z23 Encounter for immunization: Secondary | ICD-10-CM

## 2019-07-22 NOTE — Progress Notes (Signed)
   Covid-19 Vaccination Clinic  Name:  Justin Little    MRN: [id number] DOB: 1945/12/01  07/22/2019  [name] was observed post Covid-19 immunization for 15 minutes without incident. He was provided with Vaccine Information Sheet and instruction to access the V-Safe system.   [name] was instructed to call 911 with any severe reactions post vaccine: Marland Kitchen Difficulty breathing  . Swelling of face and throat  . A fast heartbeat  . A bad rash all over body  . Dizziness and weakness   Immunizations Administered    Name Date Dose VIS Date Route   Pfizer COVID-19 Vaccine 07/22/2019  4:19 PM 0.3 mL 05/01/2019 Intramuscular   Manufacturer: [place]   Lot: [id number]   [place]: [id number]

## 2019-08-25 DIAGNOSIS — I10 Essential (primary) hypertension: Secondary | ICD-10-CM | POA: Diagnosis not present

## 2019-08-25 DIAGNOSIS — E782 Mixed hyperlipidemia: Secondary | ICD-10-CM | POA: Diagnosis not present

## 2019-08-25 DIAGNOSIS — E7800 Pure hypercholesterolemia, unspecified: Secondary | ICD-10-CM | POA: Diagnosis not present

## 2019-09-22 DIAGNOSIS — E7800 Pure hypercholesterolemia, unspecified: Secondary | ICD-10-CM | POA: Diagnosis not present

## 2019-09-22 DIAGNOSIS — I10 Essential (primary) hypertension: Secondary | ICD-10-CM | POA: Diagnosis not present

## 2019-09-22 DIAGNOSIS — E782 Mixed hyperlipidemia: Secondary | ICD-10-CM | POA: Diagnosis not present

## 2019-09-24 DIAGNOSIS — Z1159 Encounter for screening for other viral diseases: Secondary | ICD-10-CM | POA: Diagnosis not present

## 2019-09-30 DIAGNOSIS — Z03818 Encounter for observation for suspected exposure to other biological agents ruled out: Secondary | ICD-10-CM | POA: Diagnosis not present

## 2019-09-30 DIAGNOSIS — Z1159 Encounter for screening for other viral diseases: Secondary | ICD-10-CM | POA: Diagnosis not present

## 2019-09-30 DIAGNOSIS — Z20828 Contact with and (suspected) exposure to other viral communicable diseases: Secondary | ICD-10-CM | POA: Diagnosis not present

## 2019-09-30 DIAGNOSIS — Z20822 Contact with and (suspected) exposure to covid-19: Secondary | ICD-10-CM | POA: Diagnosis not present

## 2019-09-30 DIAGNOSIS — I10 Essential (primary) hypertension: Secondary | ICD-10-CM | POA: Diagnosis not present

## 2019-10-01 DIAGNOSIS — Z03818 Encounter for observation for suspected exposure to other biological agents ruled out: Secondary | ICD-10-CM | POA: Diagnosis not present

## 2019-10-01 DIAGNOSIS — Z1159 Encounter for screening for other viral diseases: Secondary | ICD-10-CM | POA: Diagnosis not present

## 2019-11-02 DIAGNOSIS — E782 Mixed hyperlipidemia: Secondary | ICD-10-CM | POA: Diagnosis not present

## 2019-11-02 DIAGNOSIS — I10 Essential (primary) hypertension: Secondary | ICD-10-CM | POA: Diagnosis not present

## 2019-11-02 DIAGNOSIS — E7800 Pure hypercholesterolemia, unspecified: Secondary | ICD-10-CM | POA: Diagnosis not present

## 2019-12-17 DIAGNOSIS — Z86010 Personal history of colonic polyps: Secondary | ICD-10-CM | POA: Diagnosis not present

## 2019-12-17 DIAGNOSIS — Z125 Encounter for screening for malignant neoplasm of prostate: Secondary | ICD-10-CM | POA: Diagnosis not present

## 2019-12-17 DIAGNOSIS — R7301 Impaired fasting glucose: Secondary | ICD-10-CM | POA: Diagnosis not present

## 2019-12-17 DIAGNOSIS — H6122 Impacted cerumen, left ear: Secondary | ICD-10-CM | POA: Diagnosis not present

## 2019-12-17 DIAGNOSIS — Z0000 Encounter for general adult medical examination without abnormal findings: Secondary | ICD-10-CM | POA: Diagnosis not present

## 2019-12-17 DIAGNOSIS — G4733 Obstructive sleep apnea (adult) (pediatric): Secondary | ICD-10-CM | POA: Diagnosis not present

## 2019-12-17 DIAGNOSIS — I10 Essential (primary) hypertension: Secondary | ICD-10-CM | POA: Diagnosis not present

## 2019-12-17 DIAGNOSIS — D751 Secondary polycythemia: Secondary | ICD-10-CM | POA: Diagnosis not present

## 2019-12-17 DIAGNOSIS — E782 Mixed hyperlipidemia: Secondary | ICD-10-CM | POA: Diagnosis not present

## 2019-12-17 DIAGNOSIS — G2581 Restless legs syndrome: Secondary | ICD-10-CM | POA: Diagnosis not present

## 2019-12-18 DIAGNOSIS — E782 Mixed hyperlipidemia: Secondary | ICD-10-CM | POA: Diagnosis not present

## 2019-12-18 DIAGNOSIS — R7301 Impaired fasting glucose: Secondary | ICD-10-CM | POA: Diagnosis not present

## 2019-12-18 DIAGNOSIS — G2581 Restless legs syndrome: Secondary | ICD-10-CM | POA: Diagnosis not present

## 2019-12-18 DIAGNOSIS — Z23 Encounter for immunization: Secondary | ICD-10-CM | POA: Diagnosis not present

## 2019-12-18 DIAGNOSIS — H6120 Impacted cerumen, unspecified ear: Secondary | ICD-10-CM | POA: Diagnosis not present

## 2019-12-18 DIAGNOSIS — Z0000 Encounter for general adult medical examination without abnormal findings: Secondary | ICD-10-CM | POA: Diagnosis not present

## 2019-12-18 DIAGNOSIS — G4733 Obstructive sleep apnea (adult) (pediatric): Secondary | ICD-10-CM | POA: Diagnosis not present

## 2019-12-18 DIAGNOSIS — Z86010 Personal history of colonic polyps: Secondary | ICD-10-CM | POA: Diagnosis not present

## 2019-12-18 DIAGNOSIS — Z125 Encounter for screening for malignant neoplasm of prostate: Secondary | ICD-10-CM | POA: Diagnosis not present

## 2019-12-18 DIAGNOSIS — D751 Secondary polycythemia: Secondary | ICD-10-CM | POA: Diagnosis not present

## 2019-12-18 DIAGNOSIS — I10 Essential (primary) hypertension: Secondary | ICD-10-CM | POA: Diagnosis not present

## 2020-01-27 DIAGNOSIS — H35373 Puckering of macula, bilateral: Secondary | ICD-10-CM | POA: Diagnosis not present

## 2020-01-27 DIAGNOSIS — H40013 Open angle with borderline findings, low risk, bilateral: Secondary | ICD-10-CM | POA: Diagnosis not present

## 2020-01-27 DIAGNOSIS — H43813 Vitreous degeneration, bilateral: Secondary | ICD-10-CM | POA: Diagnosis not present

## 2020-01-27 DIAGNOSIS — H2513 Age-related nuclear cataract, bilateral: Secondary | ICD-10-CM | POA: Diagnosis not present

## 2020-02-09 DIAGNOSIS — Z20828 Contact with and (suspected) exposure to other viral communicable diseases: Secondary | ICD-10-CM | POA: Diagnosis not present

## 2020-02-09 DIAGNOSIS — Z20822 Contact with and (suspected) exposure to covid-19: Secondary | ICD-10-CM | POA: Diagnosis not present

## 2020-11-10 DIAGNOSIS — I10 Essential (primary) hypertension: Secondary | ICD-10-CM | POA: Diagnosis not present

## 2020-11-10 DIAGNOSIS — E7800 Pure hypercholesterolemia, unspecified: Secondary | ICD-10-CM | POA: Diagnosis not present

## 2020-11-10 DIAGNOSIS — Z6828 Body mass index (BMI) 28.0-28.9, adult: Secondary | ICD-10-CM | POA: Diagnosis not present

## 2020-11-10 DIAGNOSIS — D751 Secondary polycythemia: Secondary | ICD-10-CM | POA: Diagnosis not present

## 2020-12-23 DIAGNOSIS — Z1389 Encounter for screening for other disorder: Secondary | ICD-10-CM | POA: Diagnosis not present

## 2020-12-23 DIAGNOSIS — Z0000 Encounter for general adult medical examination without abnormal findings: Secondary | ICD-10-CM | POA: Diagnosis not present

## 2021-05-11 DIAGNOSIS — Z125 Encounter for screening for malignant neoplasm of prostate: Secondary | ICD-10-CM | POA: Diagnosis not present

## 2021-05-11 DIAGNOSIS — G2581 Restless legs syndrome: Secondary | ICD-10-CM | POA: Diagnosis not present

## 2021-05-11 DIAGNOSIS — E782 Mixed hyperlipidemia: Secondary | ICD-10-CM | POA: Diagnosis not present

## 2021-05-11 DIAGNOSIS — I10 Essential (primary) hypertension: Secondary | ICD-10-CM | POA: Diagnosis not present

## 2021-07-17 DIAGNOSIS — H35373 Puckering of macula, bilateral: Secondary | ICD-10-CM | POA: Diagnosis not present

## 2021-07-17 DIAGNOSIS — H40013 Open angle with borderline findings, low risk, bilateral: Secondary | ICD-10-CM | POA: Diagnosis not present

## 2021-07-17 DIAGNOSIS — H2513 Age-related nuclear cataract, bilateral: Secondary | ICD-10-CM | POA: Diagnosis not present

## 2021-07-17 DIAGNOSIS — H43813 Vitreous degeneration, bilateral: Secondary | ICD-10-CM | POA: Diagnosis not present

## 2021-11-09 DIAGNOSIS — I10 Essential (primary) hypertension: Secondary | ICD-10-CM | POA: Diagnosis not present

## 2022-03-16 DIAGNOSIS — C44311 Basal cell carcinoma of skin of nose: Secondary | ICD-10-CM | POA: Diagnosis not present

## 2022-03-16 DIAGNOSIS — D485 Neoplasm of uncertain behavior of skin: Secondary | ICD-10-CM | POA: Diagnosis not present

## 2022-03-21 DIAGNOSIS — G2581 Restless legs syndrome: Secondary | ICD-10-CM | POA: Diagnosis not present

## 2022-05-01 DIAGNOSIS — C44319 Basal cell carcinoma of skin of other parts of face: Secondary | ICD-10-CM | POA: Diagnosis not present

## 2022-05-01 DIAGNOSIS — C44311 Basal cell carcinoma of skin of nose: Secondary | ICD-10-CM | POA: Diagnosis not present

## 2022-05-01 DIAGNOSIS — D485 Neoplasm of uncertain behavior of skin: Secondary | ICD-10-CM | POA: Diagnosis not present

## 2022-05-16 DIAGNOSIS — Z1389 Encounter for screening for other disorder: Secondary | ICD-10-CM | POA: Diagnosis not present

## 2022-05-16 DIAGNOSIS — Z6828 Body mass index (BMI) 28.0-28.9, adult: Secondary | ICD-10-CM | POA: Diagnosis not present

## 2022-05-16 DIAGNOSIS — Z0000 Encounter for general adult medical examination without abnormal findings: Secondary | ICD-10-CM | POA: Diagnosis not present

## 2022-05-23 DIAGNOSIS — Z125 Encounter for screening for malignant neoplasm of prostate: Secondary | ICD-10-CM | POA: Diagnosis not present

## 2022-05-23 DIAGNOSIS — E782 Mixed hyperlipidemia: Secondary | ICD-10-CM | POA: Diagnosis not present

## 2022-05-23 DIAGNOSIS — Z0000 Encounter for general adult medical examination without abnormal findings: Secondary | ICD-10-CM | POA: Diagnosis not present

## 2022-05-23 DIAGNOSIS — G2581 Restless legs syndrome: Secondary | ICD-10-CM | POA: Diagnosis not present

## 2022-05-23 DIAGNOSIS — I10 Essential (primary) hypertension: Secondary | ICD-10-CM | POA: Diagnosis not present

## 2022-05-30 DIAGNOSIS — C44319 Basal cell carcinoma of skin of other parts of face: Secondary | ICD-10-CM | POA: Diagnosis not present

## 2022-06-28 DIAGNOSIS — U071 COVID-19: Secondary | ICD-10-CM | POA: Diagnosis not present

## 2022-07-18 DIAGNOSIS — H43813 Vitreous degeneration, bilateral: Secondary | ICD-10-CM | POA: Diagnosis not present

## 2022-07-18 DIAGNOSIS — H2513 Age-related nuclear cataract, bilateral: Secondary | ICD-10-CM | POA: Diagnosis not present

## 2022-07-18 DIAGNOSIS — H35373 Puckering of macula, bilateral: Secondary | ICD-10-CM | POA: Diagnosis not present

## 2022-07-18 DIAGNOSIS — H40013 Open angle with borderline findings, low risk, bilateral: Secondary | ICD-10-CM | POA: Diagnosis not present

## 2022-08-10 DIAGNOSIS — H2513 Age-related nuclear cataract, bilateral: Secondary | ICD-10-CM | POA: Diagnosis not present

## 2022-08-10 DIAGNOSIS — H2512 Age-related nuclear cataract, left eye: Secondary | ICD-10-CM | POA: Diagnosis not present

## 2022-08-28 DIAGNOSIS — H2512 Age-related nuclear cataract, left eye: Secondary | ICD-10-CM | POA: Diagnosis not present

## 2022-08-28 DIAGNOSIS — H269 Unspecified cataract: Secondary | ICD-10-CM | POA: Diagnosis not present

## 2022-08-30 DIAGNOSIS — Z6828 Body mass index (BMI) 28.0-28.9, adult: Secondary | ICD-10-CM | POA: Diagnosis not present

## 2022-08-30 DIAGNOSIS — H6123 Impacted cerumen, bilateral: Secondary | ICD-10-CM | POA: Diagnosis not present

## 2022-08-30 DIAGNOSIS — I209 Angina pectoris, unspecified: Secondary | ICD-10-CM | POA: Diagnosis not present

## 2022-09-06 DIAGNOSIS — H2511 Age-related nuclear cataract, right eye: Secondary | ICD-10-CM | POA: Diagnosis not present

## 2022-09-18 DIAGNOSIS — H269 Unspecified cataract: Secondary | ICD-10-CM | POA: Diagnosis not present

## 2022-09-18 DIAGNOSIS — H2511 Age-related nuclear cataract, right eye: Secondary | ICD-10-CM | POA: Diagnosis not present

## 2022-12-11 DIAGNOSIS — R7309 Other abnormal glucose: Secondary | ICD-10-CM | POA: Diagnosis not present

## 2022-12-11 DIAGNOSIS — I10 Essential (primary) hypertension: Secondary | ICD-10-CM | POA: Diagnosis not present

## 2022-12-11 DIAGNOSIS — I493 Ventricular premature depolarization: Secondary | ICD-10-CM | POA: Diagnosis not present

## 2022-12-11 DIAGNOSIS — D582 Other hemoglobinopathies: Secondary | ICD-10-CM | POA: Diagnosis not present

## 2022-12-11 DIAGNOSIS — E782 Mixed hyperlipidemia: Secondary | ICD-10-CM | POA: Diagnosis not present

## 2023-11-01 DIAGNOSIS — H43813 Vitreous degeneration, bilateral: Secondary | ICD-10-CM | POA: Diagnosis not present

## 2023-11-01 DIAGNOSIS — H40013 Open angle with borderline findings, low risk, bilateral: Secondary | ICD-10-CM | POA: Diagnosis not present

## 2023-11-01 DIAGNOSIS — Z961 Presence of intraocular lens: Secondary | ICD-10-CM | POA: Diagnosis not present

## 2023-11-01 DIAGNOSIS — H35373 Puckering of macula, bilateral: Secondary | ICD-10-CM | POA: Diagnosis not present

## 2023-12-20 DIAGNOSIS — I493 Ventricular premature depolarization: Secondary | ICD-10-CM | POA: Diagnosis not present

## 2023-12-20 DIAGNOSIS — I10 Essential (primary) hypertension: Secondary | ICD-10-CM | POA: Diagnosis not present

## 2023-12-20 DIAGNOSIS — R7301 Impaired fasting glucose: Secondary | ICD-10-CM | POA: Diagnosis not present

## 2023-12-20 DIAGNOSIS — E782 Mixed hyperlipidemia: Secondary | ICD-10-CM | POA: Diagnosis not present

## 2023-12-20 DIAGNOSIS — G2581 Restless legs syndrome: Secondary | ICD-10-CM | POA: Diagnosis not present
